# Patient Record
Sex: Male | Born: 1960 | Race: White | Hispanic: No | Marital: Married | Smoking: Never smoker
Health system: Southern US, Community
[De-identification: ages and names within clinical notes are randomized; demographics above are authoritative.]

## PROBLEM LIST (undated history)

## (undated) DIAGNOSIS — I10 Essential (primary) hypertension: Secondary | ICD-10-CM

## (undated) DIAGNOSIS — H40059 Ocular hypertension, unspecified eye: Secondary | ICD-10-CM

## (undated) DIAGNOSIS — I2510 Atherosclerotic heart disease of native coronary artery without angina pectoris: Secondary | ICD-10-CM

## (undated) DIAGNOSIS — I491 Atrial premature depolarization: Secondary | ICD-10-CM

## (undated) DIAGNOSIS — R9439 Abnormal result of other cardiovascular function study: Secondary | ICD-10-CM

## (undated) HISTORY — DX: Ocular hypertension, unspecified eye: H40.059

## (undated) HISTORY — DX: Atherosclerotic heart disease of native coronary artery without angina pectoris: I25.10

## (undated) HISTORY — DX: Abnormal result of other cardiovascular function study: R94.39

## (undated) HISTORY — PX: INGUINAL HERNIA REPAIR: SUR1180

## (undated) HISTORY — DX: Atrial premature depolarization: I49.1

## (undated) HISTORY — PX: ANKLE FRACTURE SURGERY: SHX122

---

## 2011-09-08 ENCOUNTER — Encounter (HOSPITAL_COMMUNITY): Payer: Self-pay | Admitting: Emergency Medicine

## 2011-09-08 ENCOUNTER — Inpatient Hospital Stay (HOSPITAL_COMMUNITY)
Admission: EM | Admit: 2011-09-08 | Discharge: 2011-09-12 | DRG: 491 | Disposition: A | Discharge status: Home / Self Care | Payer: 59 | Attending: Neurosurgery | Admitting: Neurosurgery

## 2011-09-08 ENCOUNTER — Observation Stay (HOSPITAL_COMMUNITY): Payer: 59

## 2011-09-08 DIAGNOSIS — I10 Essential (primary) hypertension: Secondary | ICD-10-CM | POA: Diagnosis present

## 2011-09-08 DIAGNOSIS — M545 Low back pain: Secondary | ICD-10-CM

## 2011-09-08 DIAGNOSIS — M5126 Other intervertebral disc displacement, lumbar region: Principal | ICD-10-CM | POA: Diagnosis present

## 2011-09-08 HISTORY — DX: Essential (primary) hypertension: I10

## 2011-09-08 MED ORDER — KETOROLAC TROMETHAMINE 30 MG/ML IJ SOLN
30.0000 mg | Freq: Four times a day (QID) | INTRAMUSCULAR | Status: DC | PRN
  Administered 2011-09-08 – 2011-09-10 (×3): 30 mg via INTRAVENOUS
  Filled 2011-09-08 (×2): qty 1

## 2011-09-08 MED ORDER — DIAZEPAM 5 MG/ML IJ SOLN
5.0000 mg | Freq: Once | INTRAMUSCULAR | Status: AC
  Administered 2011-09-08: 5 mg via INTRAVENOUS
  Filled 2011-09-08: qty 2

## 2011-09-08 MED ORDER — SODIUM CHLORIDE 0.9 % IV SOLN
1000.0000 mL | Freq: Once | INTRAVENOUS | Status: AC
  Administered 2011-09-08: 1000 mL via INTRAVENOUS

## 2011-09-08 MED ORDER — HYDROMORPHONE HCL PF 1 MG/ML IJ SOLN
1.0000 mg | Freq: Four times a day (QID) | INTRAMUSCULAR | Status: DC | PRN
  Administered 2011-09-08: 1 mg via INTRAVENOUS
  Filled 2011-09-08: qty 1

## 2011-09-08 MED ORDER — HYDROMORPHONE HCL PF 1 MG/ML IJ SOLN
1.0000 mg | Freq: Once | INTRAMUSCULAR | Status: AC
  Administered 2011-09-08: 1 mg via INTRAVENOUS
  Filled 2011-09-08: qty 1

## 2011-09-08 MED ORDER — DEXAMETHASONE SODIUM PHOSPHATE 10 MG/ML IJ SOLN
10.0000 mg | Freq: Once | INTRAMUSCULAR | Status: AC
  Administered 2011-09-08: 10 mg via INTRAVENOUS
  Filled 2011-09-08: qty 1

## 2011-09-08 MED ORDER — HYDROMORPHONE HCL PF 1 MG/ML IJ SOLN
1.0000 mg | INTRAMUSCULAR | Status: DC
  Administered 2011-09-09 (×3): 1 mg via INTRAVENOUS
  Filled 2011-09-08 (×3): qty 1

## 2011-09-08 MED ORDER — DIAZEPAM 5 MG PO TABS
5.0000 mg | ORAL_TABLET | Freq: Four times a day (QID) | ORAL | Status: DC | PRN
  Filled 2011-09-08 (×2): qty 1

## 2011-09-08 MED ORDER — SODIUM CHLORIDE 0.9 % IV SOLN
1000.0000 mL | Freq: Once | INTRAVENOUS | Status: DC
  Administered 2011-09-08: 1000 mL via INTRAVENOUS

## 2011-09-08 MED ORDER — SODIUM CHLORIDE 0.9 % IV SOLN
1000.0000 mL | INTRAVENOUS | Status: DC
  Administered 2011-09-09: 1000 mL via INTRAVENOUS

## 2011-09-08 MED ORDER — ONDANSETRON HCL 4 MG/2ML IJ SOLN
4.0000 mg | Freq: Four times a day (QID) | INTRAMUSCULAR | Status: DC | PRN
  Administered 2011-09-08 – 2011-09-09 (×3): 4 mg via INTRAVENOUS
  Filled 2011-09-08 (×3): qty 2

## 2011-09-08 MED ORDER — ACETAMINOPHEN 325 MG PO TABS: 650.0000 mg | ORAL_TABLET | ORAL | Status: DC | PRN

## 2011-09-08 NOTE — ED Provider Notes (Signed)
History   This chart was scribed for Hilario Quarry, MD by Melba Coon. The patient was seen in room TR09C/TR09C and the patient's care was started at 5:44PM.    CSN: [id number]  Arrival date & time 09/08/11  1723   None     Chief Complaint  Patient presents with  . Back Pain    (Consider location/radiation/quality/duration/timing/severity/associated sxs/prior treatment) The history is provided by the patient. No language interpreter was used.   Joshua Huynh is a 51 y.o. male who presents to the Emergency Department complaining of sharp, shooting, lower right back pain that radiates to the right hip with an onset 9 days ago. Pt was working out and dead lifting a normal amount of weight compared to baseline; pt had to stop workout when he felt a twinge in his back, but back started to feel better and pt continued workout. Pt woke up with the pain the next morning. Pt saw the chiropractor 7 days ago; pain got a little bit better. But 2 days ago, pt went on a roller coaster which aggravated the pain; had to leave theme park early due to the pain. Movement of the back aggravates the pain. Hydrocodone, ibuprofen, and tylenol yesterday slightly; last does of motrin was 2 PM today. No HA, fever, neck pain, sore throat, rash, CP, SOB, abd pain, n/v/d, loss of bowel or bladder control, dysuria, or extremity edema, weakness, numbness, or tingling. No known allergies. No other pertinent medical symptoms.   Past Medical History  Diagnosis Date  . Hypertension     No past surgical history on file.  No family history on file.  History  Substance Use Topics  . Smoking status: Not on file  . Smokeless tobacco: Not on file  . Alcohol Use:       Review of Systems 10 Systems reviewed and all are negative for acute change except as noted in the HPI.   Allergies  Review of patient's allergies indicates no known allergies.  Home Medications   Current Outpatient Rx  Name Route Sig  Dispense Refill  . CALCIUM PO Oral Take 1 tablet by mouth daily.    Marland Kitchen CO-ENZYME Q-10 50 MG PO CAPS Oral Take 50 mg by mouth daily.    . IBUPROFEN 600 MG PO TABS Oral Take 600 mg by mouth every 6 (six) hours as needed. For pain    . LOSARTAN POTASSIUM 50 MG PO TABS Oral Take 50 mg by mouth daily.    . ADULT MULTIVITAMIN W/MINERALS CH Oral Take 1 tablet by mouth daily.    Lanetta Inch ADVANCE PO Oral Take 1 tablet by mouth daily.    . OMEGA-3-ACID ETHYL ESTERS 1 G PO CAPS Oral Take 2 g by mouth 2 (two) times daily.      BP 142/90  Pulse 50  Temp 98 F (36.7 C) (Oral)  Resp 16  SpO2 100%  Physical Exam  Nursing note and vitals reviewed. Constitutional: He is oriented to person, place, and time. He appears well-developed and well-nourished. No distress.  HENT:  Head: Normocephalic and atraumatic.  Eyes: EOM are normal.  Neck: Neck supple. No tracheal deviation present.  Cardiovascular: Normal rate.   Pulmonary/Chest: Effort normal. No respiratory distress.  Musculoskeletal: Normal range of motion. He exhibits tenderness (Mild tenderness in lower right back).  Neurological: He is alert and oriented to person, place, and time. He has normal strength. No sensory deficit. GCS eye subscore is 4. GCS verbal subscore is 5. GCS  motor subscore is 6.  Reflex Scores:      Patellar reflexes are 3+ on the right side and 3+ on the left side.      Achilles reflexes are 1+ on the right side and 1+ on the left side.      Good equal lower extremity strength bilaterally; DTRs hyper-reflexive equal bilaterally. Gait not tested due to severe pain with movement  Skin: Skin is warm and dry.  Psychiatric: He has a normal mood and affect. His behavior is normal.    ED Course  Procedures (including critical care time)  DIAGNOSTIC STUDIES: Oxygen Saturation is 100% on room air, normal by my interpretation.    COORDINATION OF CARE:  5:49PM - pt wil be moved to CDU.   Labs Reviewed - No data to display No  results found.   No diagnosis found.    MDM  I personally performed the services described in this documentation, which was scribed in my presence. The recorded information has been reviewed and considered.   Patient care discussed with Elpidio Anis, PA-C and patient to be moved to CDU.     Hilario Quarry, MD 09/08/11 1800

## 2011-09-08 NOTE — ED Provider Notes (Signed)
The patient reports he is comfortable when he is lying still in bed. There is significant pain with any movement. No neurologic findings on re-examination, no subjective numbness or weakness. MRI showing severe central disc bulging causing stenosis L4-5. Discussed with Dr. Wynetta Emery (neurosurgery). Recommends attempt at pain control and discharge home, but if he fails to be controlled, he will admit for further treatment options. Plan - possibly back pain protocol, regular pain medications, steroids, muscle relaxers and reassess in the morning.   23:00 - attempted to ambulate patient. He states he feels better but unable to ambulate due to pain. Back pain protocol started. Plan is to reassess in the morning after steroids become effective and having regularly dosed pain medication. If pain is still uncontrolled, consult Dr. Wynetta Emery who is aware of plan.  Rodena Medin, PA-C 09/08/11 2302

## 2011-09-08 NOTE — ED Provider Notes (Signed)
History/physical exam/procedure(s) were performed by non-physician practitioner and as supervising physician I was immediately available for consultation/collaboration. I have reviewed all notes and am in agreement with care and plan.   Hilario Quarry, MD 09/08/11 [phone]

## 2011-09-08 NOTE — ED Notes (Signed)
Pt c/o lower back pain x 1 week and was unable to stand today due to pain; pt sts started while doing dead lifts and then went on a roller coaster and became more severe; pt denies incontinence

## 2011-09-09 ENCOUNTER — Encounter (HOSPITAL_COMMUNITY): Payer: Self-pay | Admitting: *Deleted

## 2011-09-09 MED ORDER — ONDANSETRON HCL 4 MG/2ML IJ SOLN
4.0000 mg | INTRAMUSCULAR | Status: DC | PRN
  Administered 2011-09-09 – 2011-09-10 (×3): 4 mg via INTRAVENOUS
  Filled 2011-09-09 (×3): qty 2

## 2011-09-09 MED ORDER — HYDROMORPHONE HCL PF 1 MG/ML IJ SOLN
1.0000 mg | INTRAMUSCULAR | Status: DC | PRN
  Administered 2011-09-09 – 2011-09-10 (×6): 1 mg via INTRAVENOUS
  Filled 2011-09-09 (×2): qty 1
  Filled 2011-09-09: qty 2
  Filled 2011-09-09 (×4): qty 1

## 2011-09-09 MED ORDER — DOCUSATE SODIUM 100 MG PO CAPS
100.0000 mg | ORAL_CAPSULE | Freq: Every day | ORAL | Status: DC
  Administered 2011-09-09 – 2011-09-12 (×4): 100 mg via ORAL
  Filled 2011-09-09 (×3): qty 1

## 2011-09-09 MED ORDER — OXYCODONE-ACETAMINOPHEN 5-325 MG PO TABS
2.0000 | ORAL_TABLET | Freq: Once | ORAL | Status: AC
  Administered 2011-09-09: 2 via ORAL
  Filled 2011-09-09: qty 2

## 2011-09-09 MED ORDER — ONDANSETRON HCL 4 MG/2ML IJ SOLN: 4.0000 mg | Freq: Three times a day (TID) | INTRAMUSCULAR | Status: DC | PRN

## 2011-09-09 MED ORDER — ONDANSETRON HCL 4 MG/2ML IJ SOLN: 4.0000 mg | Freq: Once | INTRAMUSCULAR | Status: DC

## 2011-09-09 MED ORDER — MAGNESIUM HYDROXIDE 400 MG/5ML PO SUSP: 30.0000 mL | Freq: Every evening | ORAL | Status: DC | PRN

## 2011-09-09 MED ORDER — HYDROMORPHONE HCL PF 1 MG/ML IJ SOLN: 1.0000 mg | INTRAMUSCULAR | Status: DC | PRN

## 2011-09-09 MED ORDER — DIAZEPAM 5 MG PO TABS
5.0000 mg | ORAL_TABLET | Freq: Once | ORAL | Status: AC
  Administered 2011-09-09: 5 mg via ORAL

## 2011-09-09 MED ORDER — DEXAMETHASONE SODIUM PHOSPHATE 10 MG/ML IJ SOLN
10.0000 mg | Freq: Four times a day (QID) | INTRAMUSCULAR | Status: AC
  Administered 2011-09-09 – 2011-09-10 (×4): 10 mg via INTRAVENOUS
  Filled 2011-09-09 (×4): qty 1

## 2011-09-09 MED ORDER — PANTOPRAZOLE SODIUM 40 MG PO TBEC
40.0000 mg | DELAYED_RELEASE_TABLET | Freq: Every day | ORAL | Status: DC
  Administered 2011-09-10 – 2011-09-12 (×3): 40 mg via ORAL
  Filled 2011-09-09: qty 1

## 2011-09-09 NOTE — ED Provider Notes (Signed)
7:31 AM Assumed care of patient in the CDU.  Patient is currently on back pain protocol.  Patient presenting yesterday with lower back pain that had been present for one week.  MRI showing severe central disc bulging causing stenosis L4-5.  Dr. Wynetta Emery with Neurosurgery had been consulted last evening and recommended putting the patient on Back Pain Protocol.  Patient has been given pain medications, muscle relaxers, and Decadron.  Plan is for patient to be discharged with Neurosurgery follow up if the pain has improved.  Reassessed patient.  He reports that his pain is tolerable when he is sitting still, but the pain increases with movement.  Patient has not had a muscle relaxer in 12 hours and has not had any pain medication in 5 hours.  Will order oral pain medication and muscle relaxer and then reassess.    9:58 AM Dr Wynetta Emery with Neurosurgery has been by to see and evaluate the patient.  According to the patient, Dr. Wynetta Emery will be back to see the patient later on this morning after he finishes surgery.  Attempted to ambulate the patient.  Patient unable to ambulate secondary to pain.  He was only able to take a couple of steps.  Gait unsteady.    12:45 PM Discussed patient with Dr. Wynetta Emery and informed him that the patient is having difficulty ambulating.  He recommends having the patient admitted to his service.  He will be by to see the patient later today.  Temporary admission orders have been placed.  Pascal Lux [place], PA-C 09/09/11 1621

## 2011-09-09 NOTE — ED Notes (Signed)
Report received, assumed care.  

## 2011-09-09 NOTE — Consult Note (Signed)
Reason for Consult: Back and right hip pain Referring Physician: Emergency department  Joshua Huynh is an 51 y.o. male.  HPI: Patient is a very pleasant 51 year old gentleman has had a week and a half a progress worsening back and probably right buttock and upper thigh pain is good progress he worse over the last few days he denies any true radiation further down below his knee although he says he walks on weightbears he does get an occasional feeling of outcome and as thigh he denies any numbness tingling his and his feet denies any bowel bladder complaints and denies any pain in the left leg.  Past Medical History  Diagnosis Date  . Hypertension     History reviewed. No pertinent past surgical history.  History reviewed. No pertinent family history.  Social History:  does not have a smoking history on file. He does not have any smokeless tobacco history on file. His alcohol and drug histories not on file.  Allergies: No Known Allergies  Medications: I have reviewed the patient's current medications.  No results found for this or any previous visit (from the past 48 hour(s)).  Joshua Huynh  09/08/2011  *RADIOLOGY REPORT*  Clinical Data: Low back pain radiating into the right groin and leg.  MRI LUMBAR SPINE WITHOUT Huynh  Technique:  Multiplanar and multiecho pulse sequences of the lumbar spine were obtained without intravenous Huynh.  Comparison: None.  Findings: Vertebral body height and alignment are maintained. Small hemangiomas are noted at L4 in S2.  There is no worrisome marrow lesion.  Degenerative discogenic marrow signal change is identified at L5-S1.  There is convex right scoliosis.  The conus medullaris is normal in signal and position.  No pars interarticularis defect is identified.  Imaged intra-abdominal contents are unremarkable.  The T11-12 level is imaged in the sagittal plane only and negative.  T12-L1:  Minimal disc bulge without central  canal or foraminal narrowing.  L1-2:  Negative.  L2-3:  There is some ligamentum flavum thickening and mild facet degenerative disease.  No disc bulge or protrusion.  The central spinal canal and neural foramina are  widely patent.  L3-4:  Disc bulge with some facet arthropathy and ligamentum flavum thickening identified.  The central spinal canal and neural foramina remain open.  L4-5:  The patient has a large central disc protrusion.  There may be a small amount of hemorrhage in association with the disc.  The central spinal canal and lateral recesses are severely narrowed. Foramina appear open.  L5-S1:  There is a mild disc bulge and a downturning right lateral recess protrusion.  The central canal is open but there is encroachment on the descending right S1 root in the lateral recess. Neural foramina are open.  IMPRESSION:  1.  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 2.  Disc bulge with a superimposed downturning right lateral recess protrusion at L5-S1 results in encroachment on the descending right S1 root.  Original Report Authenticated By: Bernadene Bell. Joshua Huynh, M.D.    @ROS @ Blood pressure 143/84, pulse 50, temperature 98 F (36.7 C), temperature source Oral, resp. rate 18, SpO2 98.00%. Patient is awake alert oriented 4 strength is 5 out of 5 his M. is lower surgery was also 5 out of 5 his iliopsoas, quads, hip she's, gastric, anterior tibialis, and EHL. Sensation is grossly intact in his lower extremities he does have positive straight leg raise at 30 he has  decreased ankle  jerks bilaterally  Assessment/Plan: 51 year old gentleman presents with very large disc herniation at L4-5 causing severe thecal sac compression and consistent with right L4 and L5 radiculopathy he also has a small disc herniation L5-S1 displacing the proximal aspect of the right S1 nerve root this also could be contributing patient does feel some better on the IV steroids  Decadron given the ER we'll continue to watch and see you for this she hours and this was basically stable and be discharged home with this debridement proceed forward dura with a laminectomy during this hospitalization.  Joshua Huynh 09/09/2011, 1:19 AM

## 2011-09-09 NOTE — Progress Notes (Signed)
Observation review is complete. 

## 2011-09-09 NOTE — Progress Notes (Signed)
Subjective: Patient reports He feels better this morning her pain is nondominant is significantly improved he was sitting up in bed with comfortable.  Objective: Vital signs in last 24 hours: Temp:  [97.7 F (36.5 C)-98 F (36.7 C)] 97.7 F (36.5 C) (08/13 0617) Pulse Rate:  [50-62] 62  (08/13 0617) Resp:  [16-18] 16  (08/13 0254) BP: (111-143)/(63-90) 111/63 mmHg (08/13 0617) SpO2:  [98 %-100 %] 98 % (08/13 0617)  Intake/Output from previous day:   Intake/Output this shift:    Strength out of 5  Lab Results: No results found for this basename: WBC:2,HGB:2,HCT:2,PLT:2 in the last 72 hours BMET No results found for this basename: NA:2,K:2,CL:2,CO2:2,GLUCOSE:2,BUN:2,CREATININE:2,CALCIUM:2 in the last 72 hours  Studies/Results: Mr Lumbar Spine Wo Contrast  09/08/2011  *RADIOLOGY REPORT*  Clinical Data: Low back pain radiating into the right groin and leg.  MRI LUMBAR SPINE WITHOUT CONTRAST  Technique:  Multiplanar and multiecho pulse sequences of the lumbar spine were obtained without intravenous contrast.  Comparison: None.  Findings: Vertebral body height and alignment are maintained. Small hemangiomas are noted at L4 in S2.  There is no worrisome marrow lesion.  Degenerative discogenic marrow signal change is identified at L5-S1.  There is convex right scoliosis.  The conus medullaris is normal in signal and position.  No pars interarticularis defect is identified.  Imaged intra-abdominal contents are unremarkable.  The T11-12 level is imaged in the sagittal plane only and negative.  T12-L1:  Minimal disc bulge without central canal or foraminal narrowing.  L1-2:  Negative.  L2-3:  There is some ligamentum flavum thickening and mild facet degenerative disease.  No disc bulge or protrusion.  The central spinal canal and neural foramina are  widely patent.  L3-4:  Disc bulge with some facet arthropathy and ligamentum flavum thickening identified.  The central spinal canal and neural  foramina remain open.  L4-5:  The patient has a large central disc protrusion.  There may be a small amount of hemorrhage in association with the disc.  The central spinal canal and lateral recesses are severely narrowed. Foramina appear open.  L5-S1:  There is a mild disc bulge and a downturning right lateral recess protrusion.  The central canal is open but there is encroachment on the descending right S1 root in the lateral recess. Neural foramina are open.  IMPRESSION:  1.  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 2.  Disc bulge with a superimposed downturning right lateral recess protrusion at L5-S1 results in encroachment on the descending right S1 root.  Original Report Authenticated By: Bernadene Bell. Maricela Curet, M.D.    Assessment/Plan: Continue mobilization as morning it is painful we will out: The home alone a discharge home probably will need a laminectomy discectomy Thursday or Friday we'll schedule accordingly based on how he does morning  LOS: 1 day     Joshua Huynh 09/09/2011, 9:32 AM

## 2011-09-09 NOTE — Progress Notes (Signed)
RN notified of abnormal BP

## 2011-09-10 ENCOUNTER — Encounter (HOSPITAL_COMMUNITY): Payer: Self-pay | Admitting: Certified Registered"

## 2011-09-10 ENCOUNTER — Inpatient Hospital Stay (HOSPITAL_COMMUNITY): Payer: 59

## 2011-09-10 ENCOUNTER — Inpatient Hospital Stay (HOSPITAL_COMMUNITY): Payer: 59 | Admitting: Certified Registered"

## 2011-09-10 ENCOUNTER — Encounter (HOSPITAL_COMMUNITY)
Admission: EM | Disposition: A | Discharge status: Home / Self Care | Payer: Self-pay | Source: Home / Self Care | Attending: Neurosurgery

## 2011-09-10 HISTORY — PX: LUMBAR LAMINECTOMY/DECOMPRESSION MICRODISCECTOMY: SHX5026

## 2011-09-10 LAB — POCT I-STAT 4, (NA,K, GLUC, HGB,HCT)
Glucose, Bld: 127 mg/dL — ABNORMAL HIGH (ref 70–99)
HCT: 40 % (ref 39.0–52.0)

## 2011-09-10 SURGERY — LUMBAR LAMINECTOMY/DECOMPRESSION MICRODISCECTOMY 1 LEVEL
Anesthesia: General | Site: Back | Laterality: Right | Wound class: Clean

## 2011-09-10 MED ORDER — LIDOCAINE HCL (CARDIAC) 20 MG/ML IV SOLN
INTRAVENOUS | Status: DC | PRN
  Administered 2011-09-10: 50 mg via INTRAVENOUS

## 2011-09-10 MED ORDER — 0.9 % SODIUM CHLORIDE (POUR BTL) OPTIME
TOPICAL | Status: DC | PRN
  Administered 2011-09-10: 1000 mL

## 2011-09-10 MED ORDER — HEMOSTATIC AGENTS (NO CHARGE) OPTIME
TOPICAL | Status: DC | PRN
  Administered 2011-09-10: 1 via TOPICAL

## 2011-09-10 MED ORDER — FENTANYL CITRATE 0.05 MG/ML IJ SOLN
INTRAMUSCULAR | Status: DC | PRN
  Administered 2011-09-10: 100 ug via INTRAVENOUS
  Administered 2011-09-10 (×2): 50 ug via INTRAVENOUS
  Administered 2011-09-10: 100 ug via INTRAVENOUS
  Administered 2011-09-10 (×2): 50 ug via INTRAVENOUS
  Administered 2011-09-10: 100 ug via INTRAVENOUS

## 2011-09-10 MED ORDER — CEFAZOLIN SODIUM-DEXTROSE 2-3 GM-% IV SOLR
INTRAVENOUS | Status: AC
  Filled 2011-09-10: qty 50

## 2011-09-10 MED ORDER — ROCURONIUM BROMIDE 100 MG/10ML IV SOLN
INTRAVENOUS | Status: DC | PRN
  Administered 2011-09-10: 40 mg via INTRAVENOUS
  Administered 2011-09-10: 10 mg via INTRAVENOUS

## 2011-09-10 MED ORDER — LIDOCAINE-EPINEPHRINE 1 %-1:100000 IJ SOLN
INTRAMUSCULAR | Status: DC | PRN
  Administered 2011-09-10: 10 mL via INTRADERMAL

## 2011-09-10 MED ORDER — THROMBIN 5000 UNITS EX KIT
PACK | CUTANEOUS | Status: DC | PRN
  Administered 2011-09-10 (×2): 5000 [IU] via TOPICAL

## 2011-09-10 MED ORDER — BUPIVACAINE HCL (PF) 0.25 % IJ SOLN
INTRAMUSCULAR | Status: DC | PRN
  Administered 2011-09-10: 10 mL

## 2011-09-10 MED ORDER — MIDAZOLAM HCL 5 MG/5ML IJ SOLN
INTRAMUSCULAR | Status: DC | PRN
  Administered 2011-09-10: 2 mg via INTRAVENOUS

## 2011-09-10 MED ORDER — LACTATED RINGERS IV SOLN
INTRAVENOUS | Status: DC | PRN
  Administered 2011-09-10 (×3): via INTRAVENOUS

## 2011-09-10 MED ORDER — SODIUM CHLORIDE 0.9 % IR SOLN
Status: DC | PRN
  Administered 2011-09-10: 21:00:00

## 2011-09-10 MED ORDER — DROPERIDOL 2.5 MG/ML IJ SOLN
INTRAMUSCULAR | Status: DC | PRN
  Administered 2011-09-10: .6 mg via INTRAVENOUS

## 2011-09-10 MED ORDER — CEFAZOLIN SODIUM-DEXTROSE 2-3 GM-% IV SOLR
INTRAVENOUS | Status: DC | PRN
  Administered 2011-09-10: 2 g via INTRAVENOUS

## 2011-09-10 MED ORDER — PROPOFOL 10 MG/ML IV BOLUS
INTRAVENOUS | Status: DC | PRN
  Administered 2011-09-10: 200 mg via INTRAVENOUS

## 2011-09-10 SURGICAL SUPPLY — 55 items
BAG DECANTER FOR FLEXI CONT (MISCELLANEOUS) ×2 IMPLANT
BENZOIN TINCTURE PRP APPL 2/3 (GAUZE/BANDAGES/DRESSINGS) ×2 IMPLANT
BLADE SURG 11 STRL SS (BLADE) ×2 IMPLANT
BLADE SURG ROTATE 9660 (MISCELLANEOUS) IMPLANT
BRUSH SCRUB EZ PLAIN DRY (MISCELLANEOUS) ×2 IMPLANT
BUR MATCHSTICK NEURO 3.0 LAGG (BURR) ×2 IMPLANT
BUR PRECISION FLUTE 6.0 (BURR) ×2 IMPLANT
CANISTER SUCTION 2500CC (MISCELLANEOUS) ×2 IMPLANT
CLOTH BEACON ORANGE TIMEOUT ST (SAFETY) ×2 IMPLANT
CONT SPEC 4OZ CLIKSEAL STRL BL (MISCELLANEOUS) ×2 IMPLANT
DECANTER SPIKE VIAL GLASS SM (MISCELLANEOUS) IMPLANT
DERMABOND ADVANCED (GAUZE/BANDAGES/DRESSINGS) ×1
DERMABOND ADVANCED .7 DNX12 (GAUZE/BANDAGES/DRESSINGS) ×1 IMPLANT
DRAPE LAPAROTOMY 100X72X124 (DRAPES) ×2 IMPLANT
DRAPE MICROSCOPE LEICA (MISCELLANEOUS) ×2 IMPLANT
DRAPE POUCH INSTRU U-SHP 10X18 (DRAPES) ×2 IMPLANT
DRAPE PROXIMA HALF (DRAPES) IMPLANT
DRAPE SURG 17X23 STRL (DRAPES) ×2 IMPLANT
DRSG OPSITE 4X5.5 SM (GAUZE/BANDAGES/DRESSINGS) ×2 IMPLANT
ELECT REM PT RETURN 9FT ADLT (ELECTROSURGICAL) ×2
ELECTRODE REM PT RTRN 9FT ADLT (ELECTROSURGICAL) ×1 IMPLANT
GAUZE SPONGE 4X4 16PLY XRAY LF (GAUZE/BANDAGES/DRESSINGS) IMPLANT
GLOVE BIO SURGEON STRL SZ 6.5 (GLOVE) ×4 IMPLANT
GLOVE BIO SURGEON STRL SZ8 (GLOVE) ×2 IMPLANT
GLOVE BIOGEL PI IND STRL 6.5 (GLOVE) ×1 IMPLANT
GLOVE BIOGEL PI IND STRL 8 (GLOVE) ×1 IMPLANT
GLOVE BIOGEL PI INDICATOR 6.5 (GLOVE) ×1
GLOVE BIOGEL PI INDICATOR 8 (GLOVE) ×1
GLOVE ECLIPSE 7.5 STRL STRAW (GLOVE) ×4 IMPLANT
GLOVE EXAM NITRILE LRG STRL (GLOVE) IMPLANT
GLOVE EXAM NITRILE MD LF STRL (GLOVE) ×2 IMPLANT
GLOVE EXAM NITRILE XL STR (GLOVE) IMPLANT
GLOVE EXAM NITRILE XS STR PU (GLOVE) IMPLANT
GLOVE INDICATOR 8.5 STRL (GLOVE) ×2 IMPLANT
GOWN BRE IMP SLV AUR LG STRL (GOWN DISPOSABLE) ×4 IMPLANT
GOWN BRE IMP SLV AUR XL STRL (GOWN DISPOSABLE) ×2 IMPLANT
GOWN STRL REIN 2XL LVL4 (GOWN DISPOSABLE) IMPLANT
KIT BASIN OR (CUSTOM PROCEDURE TRAY) ×2 IMPLANT
KIT ROOM TURNOVER OR (KITS) ×2 IMPLANT
NEEDLE HYPO 22GX1.5 SAFETY (NEEDLE) ×2 IMPLANT
NS IRRIG 1000ML POUR BTL (IV SOLUTION) ×2 IMPLANT
PACK LAMINECTOMY NEURO (CUSTOM PROCEDURE TRAY) ×2 IMPLANT
RUBBERBAND STERILE (MISCELLANEOUS) ×4 IMPLANT
SPONGE GAUZE 4X4 12PLY (GAUZE/BANDAGES/DRESSINGS) ×2 IMPLANT
SPONGE SURGIFOAM ABS GEL SZ50 (HEMOSTASIS) ×2 IMPLANT
STRIP CLOSURE SKIN 1/2X4 (GAUZE/BANDAGES/DRESSINGS) ×2 IMPLANT
SUT VIC AB 0 CT1 18XCR BRD8 (SUTURE) ×1 IMPLANT
SUT VIC AB 0 CT1 8-18 (SUTURE) ×1
SUT VIC AB 2-0 CT1 18 (SUTURE) ×2 IMPLANT
SUT VICRYL 4-0 PS2 18IN ABS (SUTURE) ×2 IMPLANT
SYR 20ML ECCENTRIC (SYRINGE) ×2 IMPLANT
THROMBIN 5,000 UNITS ×2 IMPLANT
TOWEL OR 17X24 6PK STRL BLUE (TOWEL DISPOSABLE) ×2 IMPLANT
TOWEL OR 17X26 10 PK STRL BLUE (TOWEL DISPOSABLE) ×2 IMPLANT
WATER STERILE IRR 1000ML POUR (IV SOLUTION) ×2 IMPLANT

## 2011-09-10 NOTE — Anesthesia Preprocedure Evaluation (Addendum)
Anesthesia Evaluation  Patient identified by MRN, date of birth, ID band Patient awake    Reviewed: Allergy & Precautions, H&P , NPO status , Patient's Chart, lab work & pertinent test results  Airway Mallampati: I TM Distance: >3 FB Neck ROM: Full    Dental No notable dental hx. (+) Dental Advisory Given and Teeth Intact   Pulmonary neg pulmonary ROS,    Pulmonary exam normal       Cardiovascular hypertension, Pt. on medications     Neuro/Psych    GI/Hepatic negative GI ROS, Neg liver ROS,   Endo/Other  negative endocrine ROS  Renal/GU negative Renal ROS     Musculoskeletal   Abdominal Normal abdominal exam  (+)   Peds  Hematology negative hematology ROS (+)   Anesthesia Other Findings   Reproductive/Obstetrics                        Anesthesia Physical Anesthesia Plan  ASA: II  Anesthesia Plan: General   Post-op Pain Management:    Induction: Intravenous  Airway Management Planned: Oral ETT  Additional Equipment:   Intra-op Plan:   Post-operative Plan: Extubation in OR  Informed Consent: I have reviewed the patients History and Physical, chart, labs and discussed the procedure including the risks, benefits and alternatives for the proposed anesthesia with the patient or authorized representative who has indicated his/her understanding and acceptance.   Dental Advisory Given  Plan Discussed with: Anesthesiologist, CRNA and Surgeon  Anesthesia Plan Comments:        Anesthesia Quick Evaluation

## 2011-09-10 NOTE — Anesthesia Procedure Notes (Signed)
Procedure Name: Intubation Date/Time: 09/10/2011 8:57 PM Performed by: Alanda Amass A Pre-anesthesia Checklist: Patient identified, Timeout performed, Emergency Drugs available, Suction available and Patient being monitored Patient Re-evaluated:Patient Re-evaluated prior to inductionOxygen Delivery Method: Circle system utilized Preoxygenation: Pre-oxygenation with 100% oxygen Intubation Type: IV induction Ventilation: Mask ventilation without difficulty Laryngoscope Size: Mac and 3 Grade View: Grade I Tube type: Oral Tube size: 7.5 mm Number of attempts: 1 Airway Equipment and Method: Stylet Placement Confirmation: ETT inserted through vocal cords under direct vision,  positive ETCO2 and breath sounds checked- equal and bilateral Secured at: 21 cm Tube secured with: Tape Dental Injury: Teeth and Oropharynx as per pre-operative assessment

## 2011-09-10 NOTE — Care Management Note (Signed)
    Page 1 of 1   09/12/2011     1:45:59 PM   CARE MANAGEMENT NOTE 09/12/2011  Patient:  Joshua Huynh, Joshua Huynh   Account Number:  [id number]  Date Initiated:  09/10/2011  Documentation initiated by:  Onnie Boer  Subjective/Objective Assessment:   PT WAS ADMITTED WITH BACK AND HIP PAIN     Action/Plan:   PROGRESSION OF CARE AND DISCHARGE PLANNING   Anticipated DC Date:  09/14/2011   Anticipated DC Plan:  HOME W HOME HEALTH SERVICES      DC Planning Services  CM consult      Choice offered to / List presented to:     DME arranged  Levan Hurst      DME agency  [facility]        Status of service:  Completed, signed off Medicare Important Message given?   (If response is "NO", the following Medicare IM given date fields will be blank) Date Medicare IM given:   Date Additional Medicare IM given:    Discharge Disposition:  HOME/SELF CARE  Per UR Regulation:  Reviewed for med. necessity/level of care/duration of stay  If discussed at Long Length of Stay Meetings, dates discussed:    Comments:  09/12/11 Onnie Boer, RN, BSN 1345 PT IS TO DC TO HOME WITH SELF CARE AND A RW FROM [facility].  09/10/11 Onnie Boer, RN, BSN 1418 PT ADMITTED WITH BACK AND HIP PAIN FROM HOME.  PT MAY HAVE SURGERY TODAY OR TOMORROW.  WILL F/U ON DC NEEDS AND PT/OT EVAL RECOMMENDATIONS.

## 2011-09-10 NOTE — Anesthesia Postprocedure Evaluation (Signed)
Anesthesia Post Note  Patient: Joshua Huynh  Procedure(s) Performed: Procedure(s) (LRB): LUMBAR LAMINECTOMY/DECOMPRESSION MICRODISCECTOMY 1 LEVEL (Right)  Anesthesia type: general  Patient location: PACU  Post pain: Pain level controlled  Post assessment: Patient's Cardiovascular Status Stable  Last Vitals:  Filed Vitals:   09/10/11 2345  BP: 125/61  Pulse: 61  Temp:   Resp: 12    Post vital signs: Reviewed and stable  Level of consciousness: sedated  Complications: No apparent anesthesia complications

## 2011-09-10 NOTE — Progress Notes (Signed)
Subjective: Patient reports Slightly better today however still unable to ambulate severe leg pain is expansile but a numbness that is new for him to  Objective: Vital signs in last 24 hours: Temp:  [97.6 F (36.4 C)-98.2 F (36.8 C)] 97.9 F (36.6 C) (08/14 0625) Pulse Rate:  [52-72] 52  (08/14 0625) Resp:  [16-18] 16  (08/14 0625) BP: (121-166)/(52-74) 128/58 mmHg (08/14 0625) SpO2:  [95 %-100 %] 97 % (08/14 0625) Weight:  [80.74 kg (178 lb)] 80.74 kg (178 lb) (08/13 2100)  Intake/Output from previous day: 08/13 0701 - 08/14 0700 In: 200 [P.O.:200] Out: -  Intake/Output this shift:    Strength is 5 out of 5 with  Lab Results: No results found for this basename: WBC:2,HGB:2,HCT:2,PLT:2 in the last 72 hours BMET No results found for this basename: NA:2,K:2,CL:2,CO2:2,GLUCOSE:2,BUN:2,CREATININE:2,CALCIUM:2 in the last 72 hours  Studies/Results: Mr Lumbar Spine Wo Contrast  09/08/2011  *RADIOLOGY REPORT*  Clinical Data: Low back pain radiating into the right groin and leg.  MRI LUMBAR SPINE WITHOUT CONTRAST  Technique:  Multiplanar and multiecho pulse sequences of the lumbar spine were obtained without intravenous contrast.  Comparison: None.  Findings: Vertebral body height and alignment are maintained. Small hemangiomas are noted at L4 in S2.  There is no worrisome marrow lesion.  Degenerative discogenic marrow signal change is identified at L5-S1.  There is convex right scoliosis.  The conus medullaris is normal in signal and position.  No pars interarticularis defect is identified.  Imaged intra-abdominal contents are unremarkable.  The T11-12 level is imaged in the sagittal plane only and negative.  T12-L1:  Minimal disc bulge without central canal or foraminal narrowing.  L1-2:  Negative.  L2-3:  There is some ligamentum flavum thickening and mild facet degenerative disease.  No disc bulge or protrusion.  The central spinal canal and neural foramina are  widely patent.  L3-4:  Disc  bulge with some facet arthropathy and ligamentum flavum thickening identified.  The central spinal canal and neural foramina remain open.  L4-5:  The patient has a large central disc protrusion.  There may be a small amount of hemorrhage in association with the disc.  The central spinal canal and lateral recesses are severely narrowed. Foramina appear open.  L5-S1:  There is a mild disc bulge and a downturning right lateral recess protrusion.  The central canal is open but there is encroachment on the descending right S1 root in the lateral recess. Neural foramina are open.  IMPRESSION:  1.  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 2.  Disc bulge with a superimposed downturning right lateral recess protrusion at L5-S1 results in encroachment on the descending right S1 root.  Original Report Authenticated By: Bernadene Bell. Maricela Curet, M.D.    Assessment/Plan: Patient with severe back pain right leg pain consistent with L5 and L4 nerve pattern with a large disc herniation L4-5 small discoloration L5-S1 plan is lumbar laminectomy discectomy possibly either this afternoon or tomorrow  LOS: 2 days     Josmar Messimer P 09/10/2011, 8:39 AM

## 2011-09-10 NOTE — Transfer of Care (Signed)
Immediate Anesthesia Transfer of Care Note  Patient: Joshua Huynh  Procedure(s) Performed: Procedure(s) (LRB): LUMBAR LAMINECTOMY/DECOMPRESSION MICRODISCECTOMY 1 LEVEL (Right)  Patient Location: PACU  Anesthesia Type: General  Level of Consciousness: awake  Airway & Oxygen Therapy: Patient Spontanous Breathing and Patient connected to nasal cannula oxygen  Post-op Assessment: Report given to PACU RN and Post -op Vital signs reviewed and stable  Post vital signs: Reviewed and stable  Complications: No apparent anesthesia complications

## 2011-09-10 NOTE — Progress Notes (Signed)
RN was notified of abnormal BP  

## 2011-09-10 NOTE — Op Note (Signed)
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  EBL: Minimal  History of present illness: Patient is a 51 year old gentleman presented emergent R. with severe back and probably right hip and upper leg pain workup with MRI scan showed a very large disc herniation with possible epidural hematoma she was placed on IV steroids was observed initiated on some ambulation however the pain never came in the adequate control he was unable to ambulate despite 48 hours of IV Decadron. Due to his failure conservative treatment has progressed clinical syndrome size location fragment with impending cauda equina syndrome patient recommended laminectomy microdiscectomy at both levels L4-5 L5-S1 and benefits of the operation were cemented patient as well as perioperative course and expectations of outcome alternatives of surgery and should agree to proceed forward.  Operative procedure: Patient brought into the or was induced under general anesthesia positioned prone the Wilson frame his back was prepped and draped in routine sterile fashion preoperative localizing appropriate level so after infiltration 10 cc lidocaine with epi a midline incision was made and Bovie light cautery was used to take down the subcutaneous tissues and subperiosteal dissection was carried on the lamina of L4-5 on S1. Intraoperative x-ray identified the appropriate level so the interest of L4 medial facet complex aggressive L5 is an drilled down and then using a 2 and 3 Kerrison punch laminotomy was begun the ligament was identified and  removed in piecemeal fashion. At this point the operating next of was draped and brought into the field him explanation to suction the markedly stented and under significant compression from herniation from underneath so the ligament was dissected off of the dura with a 4 Penfield and removed piecemeal fashion the L5 nerve root was immediately visualized the disc presented in the axilla of the L5 nerve roots a working in the axilla the annulus was incised using I nerve hook several very large free fragments of this were teased out the axilla this significantly because of the decompress the thecal sac to at this point the L5 nerve root as it was mobilized and reflected medially disc space was further incised and cleanout pituitary rongeurs using Epstein curettes suture rongeurs the disc spaces cleanout there is no further stenosis I was able result across the midline until no resistance with both hockey stick and coronary dilator does not feel like a letter additional fracture closure was indicated at the discectomy there is no further stenosis was packed with Gelfoam to second L5-S1 and a similar fashion L5-S1 was drilled down laminotomy was begun ligament is released to fashion the S1 nerve was identified the S1 pedicle was identified and there was a very large disc herniation at posterior calcified causing severe stenosis at this level so that the S1 nerve was reflected the medial medially annulotomy was made calcified disc is in place down and Epstein removed with pituitary rongeurs the discectomy here as thecal sac and right S1 nerve root was adequately decompressed both laminotomies were significantly irrigated and hemostasis was maintained Gelfoam was laid up the dura the muscle fascia pressure layers with Vicryl and the skin was) 4 septic or benzoin Steri-Strips were applied patient recovered in  stable condition at the end of case on it counts and sponge counts were correct.

## 2011-09-11 ENCOUNTER — Encounter (HOSPITAL_COMMUNITY): Payer: Self-pay | Admitting: Neurosurgery

## 2011-09-11 MED ORDER — PHENOL 1.4 % MT LIQD: 1.0000 | OROMUCOSAL | Status: DC | PRN

## 2011-09-11 MED ORDER — CYCLOBENZAPRINE HCL 10 MG PO TABS
10.0000 mg | ORAL_TABLET | Freq: Three times a day (TID) | ORAL | Status: DC | PRN
  Administered 2011-09-11 – 2011-09-12 (×3): 10 mg via ORAL
  Filled 2011-09-11 (×3): qty 1

## 2011-09-11 MED ORDER — POLYETHYLENE GLYCOL 3350 17 G PO PACK
17.0000 g | PACK | Freq: Every day | ORAL | Status: DC
  Administered 2011-09-11 – 2011-09-12 (×2): 17 g via ORAL
  Filled 2011-09-11 (×3): qty 1

## 2011-09-11 MED ORDER — ACETAMINOPHEN 325 MG PO TABS: 650.0000 mg | ORAL_TABLET | ORAL | Status: DC | PRN

## 2011-09-11 MED ORDER — SODIUM CHLORIDE 0.9 % IJ SOLN: 3.0000 mL | INTRAMUSCULAR | Status: DC | PRN

## 2011-09-11 MED ORDER — SODIUM CHLORIDE 0.9 % IV SOLN: 250.0000 mL | INTRAVENOUS | Status: DC

## 2011-09-11 MED ORDER — MENTHOL 3 MG MT LOZG: 1.0000 | LOZENGE | OROMUCOSAL | Status: DC | PRN

## 2011-09-11 MED ORDER — SODIUM CHLORIDE 0.9 % IJ SOLN
3.0000 mL | Freq: Two times a day (BID) | INTRAMUSCULAR | Status: DC
  Administered 2011-09-11: 3 mL via INTRAVENOUS

## 2011-09-11 MED ORDER — ADULT MULTIVITAMIN W/MINERALS CH
1.0000 | ORAL_TABLET | Freq: Every day | ORAL | Status: DC
  Administered 2011-09-11 – 2011-09-12 (×2): 1 via ORAL
  Filled 2011-09-11 (×2): qty 1

## 2011-09-11 MED ORDER — OMEGA-3-ACID ETHYL ESTERS 1 G PO CAPS
2.0000 g | ORAL_CAPSULE | Freq: Two times a day (BID) | ORAL | Status: DC
  Administered 2011-09-11 – 2011-09-12 (×4): 2 g via ORAL
  Filled 2011-09-11 (×5): qty 2

## 2011-09-11 MED ORDER — CEFAZOLIN SODIUM 1-5 GM-% IV SOLN
1.0000 g | Freq: Three times a day (TID) | INTRAVENOUS | Status: AC
  Administered 2011-09-11 (×2): 1 g via INTRAVENOUS
  Filled 2011-09-11 (×2): qty 50

## 2011-09-11 MED ORDER — LOSARTAN POTASSIUM 50 MG PO TABS
50.0000 mg | ORAL_TABLET | Freq: Every day | ORAL | Status: DC
  Administered 2011-09-11 – 2011-09-12 (×2): 50 mg via ORAL
  Filled 2011-09-11 (×2): qty 1

## 2011-09-11 MED ORDER — ONDANSETRON HCL 4 MG/2ML IJ SOLN
4.0000 mg | INTRAMUSCULAR | Status: DC | PRN
  Administered 2011-09-11: 4 mg via INTRAVENOUS
  Filled 2011-09-11: qty 2

## 2011-09-11 MED ORDER — ACETAMINOPHEN 650 MG RE SUPP: 650.0000 mg | RECTAL | Status: DC | PRN

## 2011-09-11 MED ORDER — HYDROMORPHONE HCL PF 1 MG/ML IJ SOLN
0.5000 mg | INTRAMUSCULAR | Status: DC | PRN
Start: 2011-09-11 — End: 2011-09-12
  Administered 2011-09-11: 1 mg via INTRAVENOUS
  Filled 2011-09-11 (×2): qty 1

## 2011-09-11 MED ORDER — OXYCODONE-ACETAMINOPHEN 5-325 MG PO TABS
1.0000 | ORAL_TABLET | ORAL | Status: DC | PRN
  Administered 2011-09-11 – 2011-09-12 (×5): 2 via ORAL
  Filled 2011-09-11 (×5): qty 2

## 2011-09-11 MED ORDER — DEXAMETHASONE SODIUM PHOSPHATE 10 MG/ML IJ SOLN
10.0000 mg | Freq: Four times a day (QID) | INTRAMUSCULAR | Status: AC
  Administered 2011-09-11 – 2011-09-12 (×4): 10 mg via INTRAVENOUS
  Filled 2011-09-11 (×4): qty 1

## 2011-09-11 NOTE — Evaluation (Signed)
Physical Therapy Evaluation Patient Details Name: Joshua Huynh MRN: [id number] DOB: 1960-08-11 Today's Date: 09/11/2011 Time: 7829-5621 PT Time Calculation (min): 29 min  PT Assessment / Plan / Recommendation Clinical Impression  Pt s/p L4-5 and L5-S1 lami/microdiscectomy. Pt is near baseline functional level, requiring increased assistance with sit/stand as well as safety on stairs for d/c home. Pt will benefit from skilled PT in the acute care setting in order to maximize functional mobility and safety prior to d/c with wife.    PT Assessment  Patient needs continued PT services    Follow Up Recommendations  No PT follow up;Supervision for mobility/OOB    Barriers to Discharge        Equipment Recommendations  Rolling walker with 5" wheels    Recommendations for Other Services     Frequency Min 5X/week    Precautions / Restrictions Precautions Precautions: Back Precaution Booklet Issued: Yes (comment) Precaution Comments: pt educated on 3/3 back precautions Restrictions Weight Bearing Restrictions: No         Mobility  Bed Mobility Bed Mobility: Rolling Left;Left Sidelying to Sit;Sitting - Scoot to Edge of Bed;Sit to Sidelying Left Rolling Left: 5: Supervision Left Sidelying to Sit: 5: Supervision Sitting - Scoot to Edge of Bed: 5: Supervision Sit to Sidelying Left: 5: Supervision Details for Bed Mobility Assistance: VC for proper sequencing to maintain back precautions during transfer. Pt able to complete without physical assist Transfers Transfers: Sit to Stand;Stand to Sit Sit to Stand: 4: Min assist;With upper extremity assist;From bed Stand to Sit: 4: Min assist;With upper extremity assist;To bed;4: Min guard Details for Transfer Assistance: VC for proper hand placement for a safe transfer to/from RW as well as cueing to maintain back precautions. Min assist for stability through LEs and pelvis into standing Ambulation/Gait Ambulation/Gait Assistance: 4:  Min guard;4: Min Environmental consultant (Feet): 300 Feet Assistive device: Rolling walker Ambulation/Gait Assistance Details: Min assist without RW for stability and support. Minguard with RW. VC for proper sequencing and safety with RW.  Gait Pattern: Step-to pattern;Antalgic Gait velocity: decreased gait speed Stairs: Yes Stairs Assistance: 4: Min assist Stairs Assistance Details (indicate cue type and reason): Min assist for stability with RW. Assisted pt and wife for safety upon d/c on stairs. Stair Management Technique: No rails;Backwards;With walker;Step to pattern Number of Stairs: 2     Exercises     PT Diagnosis: Difficulty walking;Acute pain  PT Problem List: Decreased activity tolerance;Decreased mobility;Decreased knowledge of use of DME;Decreased safety awareness;Decreased knowledge of precautions;Pain PT Treatment Interventions: DME instruction;Gait training;Stair training;Functional mobility training;Therapeutic activities;Patient/family education   PT Goals Acute Rehab PT Goals PT Goal Formulation: With patient Time For Goal Achievement: 09/18/11 Potential to Achieve Goals: Good Pt will Roll Supine to Right Side: with modified independence PT Goal: Rolling Supine to Right Side - Progress: Goal set today Pt will Roll Supine to Left Side: with modified independence PT Goal: Rolling Supine to Left Side - Progress: Goal set today Pt will go Supine/Side to Sit: with modified independence PT Goal: Supine/Side to Sit - Progress: Goal set today Pt will go Sit to Supine/Side: with modified independence PT Goal: Sit to Supine/Side - Progress: Goal set today Pt will go Sit to Stand: with modified independence PT Goal: Sit to Stand - Progress: Goal set today Pt will go Stand to Sit: with modified independence PT Goal: Stand to Sit - Progress: Goal set today Pt will Transfer Bed to Chair/Chair to Bed: with modified independence PT Transfer Goal: Bed  to Chair/Chair to Bed -  Progress: Goal set today Pt will Ambulate: >150 feet;with modified independence;with least restrictive assistive device PT Goal: Ambulate - Progress: Goal set today Pt will Go Up / Down Stairs: Flight;with supervision;with rail(s) PT Goal: Up/Down Stairs - Progress: Goal set today  Visit Information  Last PT Received On: 09/11/11 Assistance Needed: +1    Subjective Data  Patient Stated Goal: to go back to work   Prior Functioning  Home Living Lives With: Spouse;Other (Comment) (3 kids) Available Help at Discharge: Family;Other (Comment) (works part time; kids are always home) Type of Home: House Home Access: Stairs to enter [employer] of Steps: 2 Entrance Stairs-Rails: None Home Layout: Two level Alternate Level Stairs-Number of Steps: 13 Alternate Level Stairs-Rails: Can reach both Bathroom Shower/Tub: Walk-in shower;Door [employer] Toilet: Standard Bathroom Accessibility: Yes How Accessible: Accessible via walker Home Adaptive Equipment: Built-in shower seat;Reacher;Straight cane Prior Function Level of Independence: Independent Able to Take Stairs?: Yes Driving: Yes 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, sits at desk Communication Communication: No difficulties Dominant Hand: Right    Cognition  Overall Cognitive Status: Appears within functional limits for tasks assessed/performed Arousal/Alertness: Awake/alert Orientation Level: Appears intact for tasks assessed Behavior During Session: [facility] for tasks performed    Extremity/Trunk Assessment Right Lower Extremity Assessment RLE ROM/Strength/Tone: Within functional levels RLE Sensation: WFL - Light Touch Left Lower Extremity Assessment LLE ROM/Strength/Tone: Within functional levels LLE Sensation: WFL - Light Touch   Balance    End of Session PT - End of Session Equipment Utilized During Treatment: Gait belt Activity Tolerance: Patient tolerated treatment well Patient left: in  bed;with call bell/phone within reach;with family/visitor present Nurse Communication: Mobility status    Milana Kidney 09/11/2011, 4:09 PM 09/11/2011 Milana Kidney DPT PAGER: [phone] OFFICE: [phone]

## 2011-09-11 NOTE — Progress Notes (Signed)
Patient ID: Marcoantonio Legault, male   DOB: 12-31-1960, 51 y.o.   MRN: [id number] Trimox overall doing very well significant improvement in his leg pain he is having some numbness it appears to be an L5 suspicion includes his big toe he does have some slight weakness in his EHL this is been stable all days nonprogressive the numbness is bothering a part of the foot and he has no dorsiflexion weakness. Due to the setting of this being an isolated numbness and slight weakness of the EHL I think this represents an isolated radiculitis and the chances of it being a developing blood clot is extremely small at its been stable he had been on steroids prior surgery he was not on steroids postop possible this is masking some numbness he was feeling in addition with the setting of significant proven of his leg pain no other numbness or signs of cauda equina syndrome I would treat this overnight with steroids as long as it improves with part of discharge patient tomorrow if it progresses gets worse a total let us know we would that point we images spine. However due to the large nature of the disc herniation the extensive retraction in order to free up and remove the disc is not surprising that he has some swelling and irritation of the L5 nerve root.

## 2011-09-11 NOTE — Progress Notes (Signed)
Subjective: Patient reports Doing well has significantly improved leg pain has not ambulated yet is voiding  Objective: Vital signs in last 24 hours: Temp:  [97.8 F (36.6 C)-98.7 F (37.1 C)] 98.3 F (36.8 C) (08/15 0552) Pulse Rate:  [58-81] 58  (08/15 0552) Resp:  [12-20] 20  (08/15 0552) BP: (113-144)/(60-71) 113/66 mmHg (08/15 0552) SpO2:  [97 %-100 %] 99 % (08/15 0552)  Intake/Output from previous day: 08/14 0701 - 08/15 0700 In: 2150 [I.V.:2150] Out: 770 [Urine:720; Blood:50] Intake/Output this shift:    Strength is 5 out of 5 wound is clean and dry  Lab Results:  [facility] 09/10/11 2042  WBC --  HGB 13.6  HCT 40.0  PLT --   BMET  Basename 09/10/11 2042  NA 143  K 4.0  CL --  CO2 --  GLUCOSE 127*  BUN --  CREATININE --  CALCIUM --    Studies/Results: Dg Lumbar Spine 2-3 Views  09/10/2011  *RADIOLOGY REPORT*  Clinical Data: L4-L5 discectomy.  LUMBAR SPINE - 2-3 VIEW  Comparison: None.  Findings: Two intraoperative lateral spot films demonstrate intraoperative localization over the L5-S1 level.  On the second image, the soft tissue retractors are dorsal to the L5-S1 disc space and there appears to be a probe in the L5-S1 interspinous space.  This interpretation uses the MRI 09/08/2011 as a Publishing rights manager.  IMPRESSION: Intraoperative localization as above.  Original Report Authenticated By: Andreas Newport, M.D.    Assessment/Plan: Mobilized today with physical therapy this morning hospital discharged later that  LOS: 3 days     Tyron Manetta P 09/11/2011, 9:25 AM

## 2011-09-11 NOTE — ED Provider Notes (Signed)
Medical screening examination/treatment/procedure(s) were performed by non-physician practitioner and as supervising physician I was immediately available for consultation/collaboration.  Derwood Kaplan, MD 09/11/11 1709

## 2011-09-12 MED ORDER — OXYCODONE-ACETAMINOPHEN 5-325 MG PO TABS: 1.0000 | ORAL_TABLET | ORAL | Status: AC | PRN

## 2011-09-12 MED ORDER — BISACODYL 10 MG RE SUPP
10.0000 mg | Freq: Every day | RECTAL | Status: DC | PRN
  Administered 2011-09-12: 10 mg via RECTAL
  Filled 2011-09-12: qty 1

## 2011-09-12 MED ORDER — BISACODYL 10 MG RE SUPP: 10.0000 mg | Freq: Every day | RECTAL | Status: AC | PRN

## 2011-09-12 MED ORDER — CYCLOBENZAPRINE HCL 10 MG PO TABS: 10.0000 mg | ORAL_TABLET | Freq: Three times a day (TID) | ORAL | Status: AC | PRN

## 2011-09-12 NOTE — Progress Notes (Signed)
Patient ID: Joshua Huynh, male   DOB: March 31, 1960, 51 y.o.   MRN: [id number] Mr. much better significant permanent numbness in the right lower extremity improved pain ambulate well voiding spontaneous he still working on a bowel movement strength is 5 out of 5 still some slight weakness and EHL the) improved from yesterday is clean dry and

## 2011-09-12 NOTE — Progress Notes (Signed)
Physical Therapy Treatment Patient Details Name: Keelin Sheridan MRN: [id number] DOB: 22-Jan-1961 Today's Date: 09/12/2011 Time: 9147-8295 PT Time Calculation (min): 14 min  PT Assessment / Plan / Recommendation Comments on Treatment Session  Pt making great improvements, modified independent with all mobility, supervision only on stairs. All goals met, will not follow.    Follow Up Recommendations  No PT follow up;Supervision for mobility/OOB    Barriers to Discharge        Equipment Recommendations  Rolling walker with 5" wheels    Recommendations for Other Services    Frequency     Plan All goals met and education completed, patient dischaged from PT services    Precautions / Restrictions Precautions Precautions: Back Precaution Comments: pt able to verbalize and maintain 3/3 back precautions throughout session Restrictions Weight Bearing Restrictions: No   Pertinent Vitals/Pain pain 2/10    Mobility  Bed Mobility Bed Mobility: Rolling Left;Left Sidelying to Sit;Sitting - Scoot to [place] of Bed;Sit to Sidelying Left Rolling Left: 6: Modified independent (Device/Increase time) Left Sidelying to Sit: 6: Modified independent (Device/Increase time) Sitting - Scoot to Edge of Bed: 6: Modified independent (Device/Increase time) Sit to Sidelying Left: 6: Modified independent (Device/Increase time) Transfers Transfers: Sit to Stand;Stand to Sit Sit to Stand: 6: Modified independent (Device/Increase time);With upper extremity assist;From bed Stand to Sit: 6: Modified independent (Device/Increase time);With upper extremity assist;To bed Ambulation/Gait Ambulation/Gait Assistance: 6: Modified independent (Device/Increase time) Ambulation Distance (Feet): 800 Feet Assistive device: Rolling walker Ambulation/Gait Assistance Details: Pt with no gait deviations this sessions. Ambulated with RW for comfort Gait Pattern: Within Functional Limits Stairs: Yes Stairs Assistance: 5:  Supervision Stairs Assistance Details (indicate cue type and reason): Supervision for safety with pt and wife holding RW on stairs. Stair Management Technique: No rails;Backwards;With walker;Step to pattern Number of Stairs: 2       PT Goals Acute Rehab PT Goals PT Goal: Rolling Supine to Right Side - Progress: Met PT Goal: Rolling Supine to Left Side - Progress: Met PT Goal: Supine/Side to Sit - Progress: Met PT Goal: Sit to Supine/Side - Progress: Met PT Goal: Sit to Stand - Progress: Met PT Goal: Stand to Sit - Progress: Met PT Transfer Goal: Bed to Chair/Chair to Bed - Progress: Met PT Goal: Ambulate - Progress: Met PT Goal: Up/Down Stairs - Progress: Progressing toward goal  Visit Information  Last PT Received On: 09/12/11 Assistance Needed: +1    Subjective Data      Cognition  Overall Cognitive Status: Appears within functional limits for tasks assessed/performed Arousal/Alertness: Awake/alert Orientation Level: Appears intact for tasks assessed Behavior During Session: [facility] for tasks performed    Balance     End of Session PT - End of Session Equipment Utilized During Treatment: Gait belt Activity Tolerance: Patient tolerated treatment well Patient left: in bed;with call bell/phone within reach;with family/visitor present Nurse Communication: Mobility status     Milana Kidney 09/12/2011, 11:10 AM  09/12/2011 Milana Kidney DPT PAGER: [phone] OFFICE: [phone]

## 2011-09-12 NOTE — Discharge Summary (Signed)
  Physician Discharge Summary  Patient ID: Joshua Huynh MRN: [id number] DOB/AGE: 1960/04/16 51 y.o.  Admit date: 09/08/2011 Discharge date: 09/12/2011  Admission Diagnoses: Ruptured disc L4-5 and L5-S1  Discharge Diagnoses: Same Active Problems:  * No active hospital problems. *    Discharged Condition: good  Hospital Course: Patient admitted to the emergency apartment with severe back and right hip and leg pain MRI scan showed a very large ruptured disc at L4-5 as well as a secondary ruptured disc L5-S1 patient placed on IV steroids and there and felt very minimal improvement in his right leg pain and numbness the patient is a take the operating room underwent laminectomy discectomy L4-5 in the right as well as L5-S1 the right postop patient did very well significant room and the right leg pain it'll been numbness and L5 solution L5 radiculitis it responded and improved the time of discharge patient was angling and voiding spontaneously tolerating regular diet was he'll be discharged home scheduled followup approximately one 2 weeks the  Consults: Significant Diagnostic Studies: Treatments: L4-5 right laminectomy discectomy L5-S1 right laminectomy and discectomy Discharge Exam: Blood pressure 141/70, pulse 57, temperature 97.9 F (36.6 C), temperature source Oral, resp. rate 20, height 5\' 11"  (1.803 m), weight 80.74 kg (178 lb), SpO2 98.00%. Strength 5 out of 5 except slight weakness in the right EHL slight decreased sensation and L5 distributional otherwise intact  Disposition: Home   Medication List  As of 09/12/2011  7:56 AM   TAKE these medications         bisacodyl 10 MG suppository   Commonly known as: DULCOLAX   Place 1 suppository (10 mg total) rectally daily as needed.      CALCIUM PO   Take 1 tablet by mouth daily.      co-enzyme Q-10 50 MG capsule   Take 50 mg by mouth daily.      cyclobenzaprine 10 MG tablet   Commonly known as: FLEXERIL   Take 1 tablet (10  mg total) by mouth 3 (three) times daily as needed for muscle spasms.      ibuprofen 600 MG tablet   Commonly known as: ADVIL,MOTRIN   Take 600 mg by mouth every 6 (six) hours as needed. For pain      losartan 50 MG tablet   Commonly known as: COZAAR   Take 50 mg by mouth daily.      multivitamin with minerals Tabs   Take 1 tablet by mouth daily.      omega-3 acid ethyl esters 1 G capsule   Commonly known as: LOVAZA   Take 2 g by mouth 2 (two) times daily.      OSTEO ADVANCE PO   Take 1 tablet by mouth daily.      oxyCODONE-acetaminophen 5-325 MG per tablet   Commonly known as: PERCOCET/ROXICET   Take 1-2 tablets by mouth every 4 (four) hours as needed.             Signed: Jerlene Rockers P 09/12/2011, 7:56 AM

## 2011-09-12 NOTE — Progress Notes (Signed)
Physical Therapy Discharge Patient Details Name: [name] MRN: [id number] DOB: 01-19-1961 Today's Date: 09/12/2011 Time: 4098-1191 PT Time Calculation (min): 14 min  Patient discharged from PT services secondary to goals met and no further PT needs identified.  Please see latest therapy progress note for current level of functioning and progress toward goals.    Progress and discharge plan discussed with patient and/or caregiver: Patient/Caregiver agrees with plan   Joshua Huynh 09/12/2011, 11:11 AM

## 2013-04-14 ENCOUNTER — Encounter: Payer: Self-pay | Admitting: Internal Medicine

## 2013-05-23 ENCOUNTER — Ambulatory Visit (AMBULATORY_SURGERY_CENTER): Payer: Self-pay

## 2013-05-23 VITALS — Ht 71.0 in | Wt 175.0 lb

## 2013-05-23 DIAGNOSIS — Z1211 Encounter for screening for malignant neoplasm of colon: Secondary | ICD-10-CM

## 2013-05-23 MED ORDER — MOVIPREP 100 G PO SOLR: 1.0000 | Freq: Once | ORAL | Status: DC

## 2013-05-23 NOTE — Progress Notes (Signed)
No allergies to eggs or soy. No diet/weight loss meds. No home oxygen. Has email.  Emmi instructions given for colonoscopy. 

## 2013-05-27 ENCOUNTER — Encounter: Payer: Self-pay | Admitting: Internal Medicine

## 2013-06-06 ENCOUNTER — Encounter: Payer: Self-pay | Admitting: Internal Medicine

## 2013-06-06 ENCOUNTER — Ambulatory Visit (AMBULATORY_SURGERY_CENTER): Payer: 59 | Admitting: Internal Medicine

## 2013-06-06 VITALS — BP 117/74 | HR 61 | Temp 96.1°F | Resp 21 | Ht 71.0 in | Wt 175.0 lb

## 2013-06-06 DIAGNOSIS — Z1211 Encounter for screening for malignant neoplasm of colon: Secondary | ICD-10-CM

## 2013-06-06 DIAGNOSIS — D126 Benign neoplasm of colon, unspecified: Secondary | ICD-10-CM

## 2013-06-06 MED ORDER — SODIUM CHLORIDE 0.9 % IV SOLN: 500.0000 mL | INTRAVENOUS | Status: DC

## 2013-06-06 NOTE — Progress Notes (Signed)
No complaints noted in the recovery room. Maw   

## 2013-06-06 NOTE — Progress Notes (Signed)
Patient denies any allergies to eggs or soy. Patient denies any problems with anesthesia/sedation. Patient denies any oxygen use at home and does not take any diet/weight loss medications.  

## 2013-06-06 NOTE — Op Note (Signed)
[facility]  [address] [place], [id number]   COLONOSCOPY PROCEDURE REPORT  PATIENT: Joshua Huynh, Joshua Huynh  MR#: [id number] BIRTHDATE: 30-Jun-1960 , 53  yrs. old GENDER: Male ENDOSCOPIST: Eustace Quail, MD REFERRED EV:[id number] Tisovec, M.D. PROCEDURE DATE:  06/06/2013 PROCEDURE:   Colonoscopy with snare polypectomy x 1 First Screening Colonoscopy - Avg.  risk and is 50 yrs.  old or older Yes.  Prior Negative Screening - Now for repeat screening. N/A  History of Adenoma - Now for follow-up colonoscopy & has been > or = to 3 yrs.  N/A  Polyps Removed Today? Yes. ASA CLASS:   Class II INDICATIONS:average risk screening. MEDICATIONS: MAC sedation, administered by CRNA and propofol (Diprivan) 400mg  IV  DESCRIPTION OF PROCEDURE:   After the risks benefits and alternatives of the procedure were thoroughly explained, informed consent was obtained.  A digital rectal exam revealed no abnormalities of the rectum.   The [id number]  endoscope was introduced through the anus and advanced to the cecum, which was identified by both the appendix and ileocecal valve. No adverse events experienced.   The quality of the prep was excellent, using MoviPrep  The instrument was then slowly withdrawn as the colon was fully examined.   COLON FINDINGS: A sessile polyp measuring 5 mm in size was found in the ascending colon.  A polypectomy was performed with a cold snare.  The resection was complete and the polyp tissue was completely retrieved.   Mild diverticulosis was noted in the sigmoid colon.   The colon mucosa was otherwise normal. Retroflexed views revealed no abnormalities. The time to cecum=3 minutes 08 seconds.  Withdrawal time=13 minutes 26 seconds.  The scope was withdrawn and the procedure completed. COMPLICATIONS: There were no complications.  ENDOSCOPIC IMPRESSION: 1.   Sessile polyp measuring 5 mm in size was found in the ascending colon; polypectomy was  performed with a cold snare 2.   Mild diverticulosis was noted in the sigmoid colon 3.   The colon mucosa was otherwise normal  RECOMMENDATIONS: 1. Repeat colonoscopy in 5 years if polyp SSP or  adenomatous; otherwise 10 years   eSigned:  Eustace Quail, MD 06/06/2013 9:06 AM   cc: Domenick Gong, MD and The Patient

## 2013-06-06 NOTE — Patient Instructions (Signed)
YOU HAD AN ENDOSCOPIC PROCEDURE TODAY AT THE [facility]: Refer to the procedure report that was given to you for any specific questions about what was found during the examination.  If the procedure report does not answer your questions, please call your gastroenterologist to clarify.  If you requested that your care partner not be given the details of your procedure findings, then the procedure report has been included in a sealed envelope for you to review at your convenience later.  YOU SHOULD EXPECT: Some feelings of bloating in the abdomen. Passage of more gas than usual.  Walking can help get rid of the air that was put into your GI tract during the procedure and reduce the bloating. If you had a lower endoscopy (such as a colonoscopy or flexible sigmoidoscopy) you may notice spotting of blood in your stool or on the toilet paper. If you underwent a bowel prep for your procedure, then you may not have a normal bowel movement for a few days.  DIET: Your first meal following the procedure should be a light meal and then it is ok to progress to your normal diet.  A half-sandwich or bowl of soup is an example of a good first meal.  Heavy or fried foods are harder to digest and may make you feel nauseous or bloated.  Likewise meals heavy in dairy and vegetables can cause extra gas to form and this can also increase the bloating.  Drink plenty of fluids but you should avoid alcoholic beverages for 24 hours.  ACTIVITY: Your care partner should take you home directly after the procedure.  You should plan to take it easy, moving slowly for the rest of the day.  You can resume normal activity the day after the procedure however you should NOT DRIVE or use heavy machinery for 24 hours (because of the sedation medicines used during the test).    SYMPTOMS TO REPORT IMMEDIATELY: A gastroenterologist can be reached at any hour.  During normal business hours, 8:30 AM to 5:00 PM Monday through Friday,  call [phone].  After hours and on weekends, please call the GI answering service at [phone] who will take a message and have the physician on call contact you.   Following lower endoscopy (colonoscopy or flexible sigmoidoscopy):  Excessive amounts of blood in the stool  Significant tenderness or worsening of abdominal pains  Swelling of the abdomen that is new, acute  Fever of 100F or higher  FOLLOW UP: If any biopsies were taken you will be contacted by phone or by letter within the next 1-3 weeks.  Call your gastroenterologist if you have not heard about the biopsies in 3 weeks.  Our staff will call the home number listed on your records the next business day following your procedure to check on you and address any questions or concerns that you may have at that time regarding the information given to you following your procedure. This is a courtesy call and so if there is no answer at the home number and we have not heard from you through the emergency physician on call, we will assume that you have returned to your regular daily activities without incident.  SIGNATURES/CONFIDENTIALITY: You and/or your care partner have signed paperwork which will be entered into your electronic medical record.  These signatures attest to the fact that that the information above on your After Visit Summary has been reviewed and is understood.  Full responsibility of the confidentiality of this   discharge information lies with you and/or your care-partner.     Handouts were given to your care partner on polyps, diverticulosis, and a high fiber diet with liberal fluid intake. You may resume your current medications today. Await biopsy results. Please call if any questions or concerns.   

## 2013-06-07 ENCOUNTER — Telehealth: Payer: Self-pay | Admitting: *Deleted

## 2013-06-07 NOTE — Telephone Encounter (Signed)
  Follow up Call-  Call back number 06/06/2013  Post procedure Call Back phone  # [phone]  Permission to leave phone message Yes     Patient questions:  Do you have a fever, pain , or abdominal swelling? no Pain Score  0 *  Have you tolerated food without any problems? yes  Have you been able to return to your normal activities? yes  Do you have any questions about your discharge instructions: Diet   no Medications  no Follow up visit  no  Do you have questions or concerns about your Care? no  Actions: * If pain score is 4 or above: No action needed, pain <4.

## 2013-06-09 ENCOUNTER — Encounter: Payer: Self-pay | Admitting: Internal Medicine

## 2014-01-21 ENCOUNTER — Emergency Department (HOSPITAL_COMMUNITY): Payer: 59

## 2014-01-21 ENCOUNTER — Emergency Department (HOSPITAL_COMMUNITY)
Admission: EM | Admit: 2014-01-21 | Discharge: 2014-01-21 | Disposition: A | Discharge status: Home / Self Care | Payer: 59 | Attending: Emergency Medicine | Admitting: Emergency Medicine

## 2014-01-21 ENCOUNTER — Encounter (HOSPITAL_COMMUNITY): Payer: Self-pay | Admitting: Family Medicine

## 2014-01-21 DIAGNOSIS — R079 Chest pain, unspecified: Secondary | ICD-10-CM

## 2014-01-21 DIAGNOSIS — Z79899 Other long term (current) drug therapy: Secondary | ICD-10-CM | POA: Diagnosis not present

## 2014-01-21 DIAGNOSIS — M545 Low back pain, unspecified: Secondary | ICD-10-CM

## 2014-01-21 DIAGNOSIS — H40059 Ocular hypertension, unspecified eye: Secondary | ICD-10-CM | POA: Insufficient documentation

## 2014-01-21 DIAGNOSIS — I10 Essential (primary) hypertension: Secondary | ICD-10-CM | POA: Insufficient documentation

## 2014-01-21 DIAGNOSIS — Z7982 Long term (current) use of aspirin: Secondary | ICD-10-CM | POA: Insufficient documentation

## 2014-01-21 DIAGNOSIS — M549 Dorsalgia, unspecified: Secondary | ICD-10-CM | POA: Diagnosis present

## 2014-01-21 DIAGNOSIS — R9431 Abnormal electrocardiogram [ECG] [EKG]: Secondary | ICD-10-CM

## 2014-01-21 DIAGNOSIS — R109 Unspecified abdominal pain: Secondary | ICD-10-CM | POA: Diagnosis not present

## 2014-01-21 LAB — COMPREHENSIVE METABOLIC PANEL
ALK PHOS: 48 U/L (ref 39–117)
ALT: 32 U/L (ref 0–53)
AST: 32 U/L (ref 0–37)
Albumin: 4.3 g/dL (ref 3.5–5.2)
Anion gap: 7 (ref 5–15)
BUN: 15 mg/dL (ref 6–23)
CHLORIDE: 106 meq/L (ref 96–112)
CO2: 26 mmol/L (ref 19–32)
Calcium: 9.4 mg/dL (ref 8.4–10.5)
Creatinine, Ser: 1.07 mg/dL (ref 0.50–1.35)
GFR calc non Af Amer: 79 mL/min — ABNORMAL LOW (ref >90)
GLUCOSE: 105 mg/dL — AB (ref 70–99)
POTASSIUM: 4 mmol/L (ref 3.5–5.1)
SODIUM: 139 mmol/L (ref 135–145)
TOTAL PROTEIN: 7 g/dL (ref 6.0–8.3)
Total Bilirubin: 0.8 mg/dL (ref 0.3–1.2)

## 2014-01-21 LAB — CBC WITH DIFFERENTIAL/PLATELET
BASOS PCT: 0 % (ref 0–1)
Basophils Absolute: 0 10*3/uL (ref 0.0–0.1)
EOS ABS: 0 10*3/uL (ref 0.0–0.7)
Eosinophils Relative: 0 % (ref 0–5)
HCT: 39.5 % (ref 39.0–52.0)
HEMOGLOBIN: 13.4 g/dL (ref 13.0–17.0)
Lymphocytes Relative: 16 % (ref 12–46)
Lymphs Abs: 1.1 10*3/uL (ref 0.7–4.0)
MCH: 30.5 pg (ref 26.0–34.0)
MCHC: 33.9 g/dL (ref 30.0–36.0)
MCV: 90 fL (ref 78.0–100.0)
MONO ABS: 0.3 10*3/uL (ref 0.1–1.0)
MONOS PCT: 5 % (ref 3–12)
NEUTROS PCT: 79 % — AB (ref 43–77)
Neutro Abs: 5.3 10*3/uL (ref 1.7–7.7)
Platelets: 208 10*3/uL (ref 150–400)
RBC: 4.39 MIL/uL (ref 4.22–5.81)
RDW: 13.1 % (ref 11.5–15.5)
WBC: 6.8 10*3/uL (ref 4.0–10.5)

## 2014-01-21 LAB — URINALYSIS, ROUTINE W REFLEX MICROSCOPIC
BILIRUBIN URINE: NEGATIVE
GLUCOSE, UA: NEGATIVE mg/dL
HGB URINE DIPSTICK: NEGATIVE
Ketones, ur: 15 mg/dL — AB
Leukocytes, UA: NEGATIVE
Nitrite: NEGATIVE
PH: 5.5 (ref 5.0–8.0)
Protein, ur: NEGATIVE mg/dL
SPECIFIC GRAVITY, URINE: 1.01 (ref 1.005–1.030)
Urobilinogen, UA: 0.2 mg/dL (ref 0.0–1.0)

## 2014-01-21 LAB — LIPASE, BLOOD: Lipase: 23 U/L (ref 11–59)

## 2014-01-21 LAB — I-STAT TROPONIN, ED: TROPONIN I, POC: 0.01 ng/mL (ref 0.00–0.08)

## 2014-01-21 MED ORDER — OXYCODONE-ACETAMINOPHEN 5-325 MG PO TABS: 1.0000 | ORAL_TABLET | ORAL | Status: DC | PRN

## 2014-01-21 MED ORDER — METHOCARBAMOL 750 MG PO TABS: 750.0000 mg | ORAL_TABLET | Freq: Four times a day (QID) | ORAL | Status: DC

## 2014-01-21 MED ORDER — PANTOPRAZOLE SODIUM 20 MG PO TBEC: 20.0000 mg | DELAYED_RELEASE_TABLET | Freq: Every day | ORAL | Status: DC

## 2014-01-21 NOTE — ED Notes (Signed)
Ambulated to restroom  

## 2014-01-21 NOTE — ED Provider Notes (Signed)
CSN: [id number]     Arrival date & time 01/21/14  1252 History   First MD Initiated Contact with Patient 01/21/14 1328     Chief Complaint  Patient presents with  . Abdominal Pain  . Back Pain     (Consider location/radiation/quality/duration/timing/severity/associated sxs/prior Treatment) HPI Comments: Patient here complaining of abnormal EKG from urgent care. Went to urgent care today because of having left-sided flank pain 24 hours that was characterized as sharp and worse with movement and nonradiating. Low-grade temperature as well 2 without cough or congestion. Urinalysis noted from urgent care and was negative for infection. Patient has been having GERD type symptoms described as epigastric discomfort worse with drinking coffee and eating with increased flatus and burping. Patient had an EKG done which showed some abnormal T-wave inversions in leads 3 and aVF. Patient denies any chest pain or shortness of breath. Denies any diaphoresis. Denies any exercise intolerance. Was sent here for further evaluation  Patient is a 53 y.o. male presenting with abdominal pain and back pain. The history is provided by the patient.  Abdominal Pain Back Pain Associated symptoms: abdominal pain     Past Medical History  Diagnosis Date  . Hypertension   . Intraocular pressure increase    Past Surgical History  Procedure Laterality Date  . Lumbar laminectomy/decompression microdiscectomy  09/10/2011    Procedure: LUMBAR LAMINECTOMY/DECOMPRESSION MICRODISCECTOMY 1 LEVEL;  Surgeon: Elaina Hoops, MD;  Location: [facility];  Service: Neurosurgery;  Laterality: Right;  Right Lumbar Laminectomy/Decompression Microdiscectomy Lumbar Four-Five, Five-Sacral One  . Inguinal hernia repair      x2  . Ankle fracture surgery     Family History  Problem Relation Age of Onset  . Colon cancer Neg Hx   . Pancreatic cancer Neg Hx   . Stomach cancer Neg Hx   . Rectal cancer Neg Hx    History  Substance Use  Topics  . Smoking status: Never Smoker   . Smokeless tobacco: Never Used  . Alcohol Use: 3.0 oz/week    5 Glasses of wine per week     Comment: 5     Review of Systems  Gastrointestinal: Positive for abdominal pain.  Musculoskeletal: Positive for back pain.  All other systems reviewed and are negative.     Allergies  Review of patient's allergies indicates no known allergies.  Home Medications   Prior to Admission medications   Medication Sig Start Date End Date Taking? Authorizing Provider  aspirin 81 MG tablet Take 81 mg by mouth daily.   Yes Historical Provider, MD  Cholecalciferol (VITAMIN D3) 2000 UNITS TABS Take 2 tablets by mouth.   Yes Historical Provider, MD  Coenzyme Q10 (CO Q 10 PO) Take 300 mg by mouth daily.   Yes Historical Provider, MD  GLUCOSAMINE HCL-MSM PO Take 2 tablets by mouth.   Yes Historical Provider, MD  ibuprofen (ADVIL,MOTRIN) 600 MG tablet Take 600 mg by mouth every 6 (six) hours as needed. For pain   Yes Historical Provider, MD  Multiple Vitamin (MULTIVITAMIN WITH MINERALS) TABS Take 1 tablet by mouth daily.   Yes Historical Provider, MD  Nutritional Supplements (OSTEO ADVANCE PO) Take 1 tablet by mouth daily.   Yes Historical Provider, MD  olmesartan (BENICAR) 40 MG tablet Take 40 mg by mouth daily.   Yes Historical Provider, MD  OVER THE COUNTER MEDICATION 1,400 mg. Nature's Bounty Fish Oil   Yes Historical Provider, MD  OVER THE COUNTER MEDICATION Nature's Bounty Probiotic one daily  Yes Historical Provider, MD  travoprost, benzalkonium, (TRAVATAN) 0.004 % ophthalmic solution Place 1 drop into both eyes at bedtime.    Yes Historical Provider, MD  MOVIPREP 100 G SOLR Take 1 kit (200 g total) by mouth once. 05/23/13   Irene Shipper, MD   BP 143/82 mmHg  Pulse 52  Temp(Src) 97.9 F (36.6 C)  Resp 16  SpO2 100% Physical Exam  Constitutional: He is oriented to person, place, and time. He appears well-developed and well-nourished.  Non-toxic  appearance. No distress.  HENT:  Head: Normocephalic and atraumatic.  Eyes: Conjunctivae, EOM and lids are normal. Pupils are equal, round, and reactive to light.  Neck: Normal range of motion. Neck supple. No tracheal deviation present. No thyroid mass present.  Cardiovascular: Normal rate, regular rhythm and normal heart sounds.  Exam reveals no gallop.   No murmur heard. Pulmonary/Chest: Effort normal and breath sounds normal. No stridor. No respiratory distress. He has no decreased breath sounds. He has no wheezes. He has no rhonchi. He has no rales.  Abdominal: Soft. Normal appearance and bowel sounds are normal. He exhibits no distension. There is no tenderness. There is no rebound and no CVA tenderness.  Musculoskeletal: Normal range of motion. He exhibits no edema or tenderness.       Back:  Neurological: He is alert and oriented to person, place, and time. He has normal strength. No cranial nerve deficit or sensory deficit. GCS eye subscore is 4. GCS verbal subscore is 5. GCS motor subscore is 6.  Skin: Skin is warm and dry. No abrasion and no rash noted.  Psychiatric: He has a normal mood and affect. His speech is normal and behavior is normal.  Nursing note and vitals reviewed.   ED Course  Procedures (including critical care time) Labs Review Labs Reviewed  COMPREHENSIVE METABOLIC PANEL  CBC WITH DIFFERENTIAL  LIPASE, BLOOD  URINALYSIS, ROUTINE W REFLEX MICROSCOPIC  I-STAT TROPOININ, ED    Imaging Review No results found.   EKG Interpretation   Date/Time:  Saturday January 21 2014 13:09:45 EST Ventricular Rate:  51 PR Interval:  172 QRS Duration: 98 QT Interval:  454 QTC Calculation: 418 R Axis:   76 Text Interpretation:  Sinus bradycardia Otherwise normal ECG Confirmed by  Jomayra Novitsky  MD, Tobias Avitabile ([id number]) on 01/21/2014 2:30:33 PM      MDM   Final diagnoses:  Chest pain    Pt given referral to cardiology on call--no concern for acs, no cardiac arrythmia  sx--will place on PPI  Leota Jacobsen, MD 01/21/14 1534

## 2014-01-21 NOTE — ED Notes (Signed)
Pt here for epigastric pain radiating into back with nausea and slight fever. Sent here by [facility] with abnormal EKG.

## 2014-01-21 NOTE — ED Notes (Signed)
Pt here for epigastric pain and back pain,sent here from [facility] for ekg changes, on arrival here ekg displays sinus brady, wife at bedside.

## 2014-01-21 NOTE — Discharge Instructions (Signed)
Gastroesophageal Reflux Disease, Adult Gastroesophageal reflux disease (GERD) happens when acid from your stomach flows up into the esophagus. When acid comes in contact with the esophagus, the acid causes soreness (inflammation) in the esophagus. Over time, GERD may create small holes (ulcers) in the lining of the esophagus. CAUSES   Increased body weight. This puts pressure on the stomach, making acid rise from the stomach into the esophagus.  Smoking. This increases acid production in the stomach.  Drinking alcohol. This causes decreased pressure in the lower esophageal sphincter (valve or ring of muscle between the esophagus and stomach), allowing acid from the stomach into the esophagus.  Late evening meals and a full stomach. This increases pressure and acid production in the stomach.  A malformed lower esophageal sphincter. Sometimes, no cause is found. SYMPTOMS   Burning pain in the lower part of the mid-chest behind the breastbone and in the mid-stomach area. This may occur twice a week or more often.  Trouble swallowing.  Sore throat.  Dry cough.  Asthma-like symptoms including chest tightness, shortness of breath, or wheezing. DIAGNOSIS  Your caregiver may be able to diagnose GERD based on your symptoms. In some cases, X-rays and other tests may be done to check for complications or to check the condition of your stomach and esophagus. TREATMENT  Your caregiver may recommend over-the-counter or prescription medicines to help decrease acid production. Ask your caregiver before starting or adding any new medicines.  HOME CARE INSTRUCTIONS   Change the factors that you can control. Ask your caregiver for guidance concerning weight loss, quitting smoking, and alcohol consumption.  Avoid foods and drinks that make your symptoms worse, such as:  Caffeine or alcoholic drinks.  Chocolate.  Peppermint or mint flavorings.  Garlic and onions.  Spicy foods.  Citrus fruits,  such as oranges, lemons, or limes.  Tomato-based foods such as sauce, chili, salsa, and pizza.  Fried and fatty foods.  Avoid lying down for the 3 hours prior to your bedtime or prior to taking a nap.  Eat small, frequent meals instead of large meals.  Wear loose-fitting clothing. Do not wear anything tight around your waist that causes pressure on your stomach.  Raise the head of your bed 6 to 8 inches with wood blocks to help you sleep. Extra pillows will not help.  Only take over-the-counter or prescription medicines for pain, discomfort, or fever as directed by your caregiver.  Do not take aspirin, ibuprofen, or other nonsteroidal anti-inflammatory drugs (NSAIDs). SEEK IMMEDIATE MEDICAL CARE IF:   You have pain in your arms, neck, jaw, teeth, or back.  Your pain increases or changes in intensity or duration.  You develop nausea, vomiting, or sweating (diaphoresis).  You develop shortness of breath, or you faint.  Your vomit is green, yellow, black, or looks like coffee grounds or blood.  Your stool is red, bloody, or black. These symptoms could be signs of other problems, such as heart disease, gastric bleeding, or esophageal bleeding. MAKE SURE YOU:   Understand these instructions.  Will watch your condition.  Will get help right away if you are not doing well or get worse. Document Released: 10/23/2004 Document Revised: 04/07/2011 Document Reviewed: 08/02/2010 [facility] Patient Information 2015 [place], [place]. This information is not intended to replace advice given to you by your health care provider. Make sure you discuss any questions you have with your health care provider. Back Pain, Adult Low back pain is very common. About 1 in 5 people have back  pain.The cause of low back pain is rarely dangerous. The pain often gets better over time.About half of people with a sudden onset of back pain feel better in just 2 weeks. About 8 in 10 people feel better by 6 weeks.    CAUSES Some common causes of back pain include:  Strain of the muscles or ligaments supporting the spine.  Wear and tear (degeneration) of the spinal discs.  Arthritis.  Direct injury to the back. DIAGNOSIS Most of the time, the direct cause of low back pain is not known.However, back pain can be treated effectively even when the exact cause of the pain is unknown.Answering your caregiver's questions about your overall health and symptoms is one of the most accurate ways to make sure the cause of your pain is not dangerous. If your caregiver needs more information, he or she may order lab work or imaging tests (X-rays or MRIs).However, even if imaging tests show changes in your back, this usually does not require surgery. HOME CARE INSTRUCTIONS For many people, back pain returns.Since low back pain is rarely dangerous, it is often a condition that people can learn to [facility] their own.   Remain active. It is stressful on the back to sit or stand in one place. Do not sit, drive, or stand in one place for more than 30 minutes at a time. Take short walks on level surfaces as soon as pain allows.Try to increase the length of time you walk each day.  Do not stay in bed.Resting more than 1 or 2 days can delay your recovery.  Do not avoid exercise or work.Your body is made to move.It is not dangerous to be active, even though your back may hurt.Your back will likely heal faster if you return to being active before your pain is gone.  Pay attention to your body when you bend and lift. Many people have less discomfortwhen lifting if they bend their knees, keep the load close to their bodies,and avoid twisting. Often, the most comfortable positions are those that put less stress on your recovering back.  Find a comfortable position to sleep. Use a firm mattress and lie on your side with your knees slightly bent. If you lie on your back, put a pillow under your knees.  Only take  over-the-counter or prescription medicines as directed by your caregiver. Over-the-counter medicines to reduce pain and inflammation are often the most helpful.Your caregiver may prescribe muscle relaxant drugs.These medicines help dull your pain so you can more quickly return to your normal activities and healthy exercise.  Put ice on the injured area.  Put ice in a plastic bag.  Place a towel between your skin and the bag.  Leave the ice on for 15-20 minutes, 03-04 times a day for the first 2 to 3 days. After that, ice and heat may be alternated to reduce pain and spasms.  Ask your caregiver about trying back exercises and gentle massage. This may be of some benefit.  Avoid feeling anxious or stressed.Stress increases muscle tension and can worsen back pain.It is important to recognize when you are anxious or stressed and learn ways to manage it.Exercise is a great option. SEEK MEDICAL CARE IF:  You have pain that is not relieved with rest or medicine.  You have pain that does not improve in 1 week.  You have new symptoms.  You are generally not feeling well. SEEK IMMEDIATE MEDICAL CARE IF:   You have pain that radiates from your back  into your legs.  You develop new bowel or bladder control problems.  You have unusual weakness or numbness in your arms or legs.  You develop nausea or vomiting.  You develop abdominal pain.  You feel faint. Document Released: 01/13/2005 Document Revised: 07/15/2011 Document Reviewed: 05/17/2013 [facility] Patient Information 2015 [place], [place]. This information is not intended to replace advice given to you by your health care provider. Make sure you discuss any questions you have with your health care provider.

## 2014-01-27 DIAGNOSIS — R9439 Abnormal result of other cardiovascular function study: Secondary | ICD-10-CM

## 2014-01-27 HISTORY — DX: Abnormal result of other cardiovascular function study: R94.39

## 2014-01-31 ENCOUNTER — Encounter: Payer: Self-pay | Admitting: *Deleted

## 2014-02-02 ENCOUNTER — Ambulatory Visit (INDEPENDENT_AMBULATORY_CARE_PROVIDER_SITE_OTHER): Payer: 59 | Admitting: Cardiovascular Disease

## 2014-02-02 ENCOUNTER — Encounter: Payer: Self-pay | Admitting: Cardiovascular Disease

## 2014-02-02 ENCOUNTER — Ambulatory Visit (HOSPITAL_COMMUNITY): Payer: 59

## 2014-02-02 VITALS — BP 118/86 | HR 57 | Wt 176.0 lb

## 2014-02-02 DIAGNOSIS — R072 Precordial pain: Secondary | ICD-10-CM

## 2014-02-02 DIAGNOSIS — R9431 Abnormal electrocardiogram [ECG] [EKG]: Secondary | ICD-10-CM

## 2014-02-02 NOTE — Progress Notes (Addendum)
History of Present Illness: 54 yo male with history of HTN who is here today as a new patient for evaluation of chest pain. He was seen in the ED at [facility] 01/21/14 for c/o epigastric pain, worsened after eating. EKG with non-specific T wave changes. He tells me that he has no prior cardiac issues. He has chronic back issues. He strained his back on 01/21/14 and went to the ED. His chest pain then seemed to be reflux or referred pain from his back. He has had no exertional chest pain. He exercises every day. He denies any SOB. Overall feels well.   Primary Care Physician: T[facility]  Past Medical History  Diagnosis Date  . Hypertension   . Intraocular pressure increase     Past Surgical History  Procedure Laterality Date  . Lumbar laminectomy/decompression microdiscectomy  09/10/2011    Procedure: LUMBAR LAMINECTOMY/DECOMPRESSION MICRODISCECTOMY 1 LEVEL;  Surgeon: Elaina Hoops, MD;  Location: [place];  Service: Neurosurgery;  Laterality: Right;  Right Lumbar Laminectomy/Decompression Microdiscectomy Lumbar Four-Five, Five-Sacral One  . Inguinal hernia repair      x2  . Ankle fracture surgery      Current Outpatient Prescriptions  Medication Sig Dispense Refill  . aspirin 81 MG tablet Take 81 mg by mouth daily.    . Cholecalciferol (VITAMIN D3) 2000 UNITS TABS Take 2 tablets by mouth.    . Coenzyme Q10 (CO Q 10 PO) Take 300 mg by mouth daily.    Marland Kitchen GLUCOSAMINE HCL-MSM PO Take 2 tablets by mouth.    . magnesium oxide (MAG-OX) 400 MG tablet Take 400 mg by mouth daily. One table by mouth daily    . Multiple Vitamin (MULTIVITAMIN WITH MINERALS) TABS Take 1 tablet by mouth daily.    . Nutritional Supplements (OSTEO ADVANCE PO) Take 1 tablet by mouth daily.    Marland Kitchen olmesartan (BENICAR) 40 MG tablet Take 40 mg by mouth daily.    Marland Kitchen OVER THE COUNTER MEDICATION 1,400 mg. Nature's Bounty Fish Oil    . OVER THE COUNTER MEDICATION Nature's Bounty Probiotic one daily    . pantoprazole (PROTONIX) 20  MG tablet Take 1 tablet (20 mg total) by mouth daily. 30 tablet 0  . travoprost, benzalkonium, (TRAVATAN) 0.004 % ophthalmic solution Place 1 drop into both eyes at bedtime.     . Turmeric 500 MG CAPS Take 500 mg by mouth 2 (two) times daily. 2 by mouth daily     No current facility-administered medications for this visit.    No Known Allergies  History   Social History  . Marital Status: Married    Spouse Name: N/A    Number of Children: 3  . Years of Education: N/A   Occupational History  . Environmental consultant    Social History Main Topics  . Smoking status: Never Smoker   . Smokeless tobacco: Never Used  . Alcohol Use: 3.0 oz/week    5 Glasses of wine per week     Comment: 5   . Drug Use: No  . Sexual Activity: Not on file   Other Topics Concern  . Not on file   Social History Narrative    Family History  Problem Relation Age of Onset  . Colon cancer Neg Hx   . Pancreatic cancer Neg Hx   . Stomach cancer Neg Hx   . Rectal cancer Neg Hx   . Coronary artery disease Maternal Uncle   . Stroke Father   . Coronary  artery disease Brother     Review of Systems:  As stated in the HPI and otherwise negative.   BP 118/86 mmHg  Pulse 57  Wt 176 lb (79.833 kg)  SpO2 99%  Physical Examination: General: Well developed, well nourished, NAD HEENT: OP clear, mucus membranes moist SKIN: warm, dry. No rashes. Neuro: No focal deficits Musculoskeletal: Muscle strength 5/5 all ext Psychiatric: Mood and affect normal Neck: No JVD, no carotid bruits, no thyromegaly, no lymphadenopathy. Lungs:Clear bilaterally, no wheezes, rhonci, crackles Cardiovascular: Regular rate and rhythm. No murmurs, gallops or rubs. Abdomen:Soft. Bowel sounds present. Non-tender.  Extremities: No lower extremity edema. Pulses are 2 + in the bilateral DP/PT.  EKG: Sinus brady, rate 57 bpm.   Assessment and Plan:   1. Chest pain: Atypical. Not exertional. EKG shows flipped inferior T waves. Given family  history of CAD, personal history of HTN and abnormal EKG will arrange exercise stress test. Will arrange echo to assess LVEF.   2. Abnormal EKG: Cardiac testing as above.   Addendum: Stress test with ischemic EKG changes. Given clinical story, will arrange cardiac cath on 03/03/14 at [facility]. Risks and benefits reviewed with pt.   Alem Fahl 4:35 PM 02/27/2014

## 2014-02-02 NOTE — Patient Instructions (Signed)
Your physician recommends that you schedule a follow-up appointment in: 6-8 weeks.   Your physician has requested that you have an exercise tolerance test. For further information please visit [email or web address]. Please also follow instruction sheet, as given.   Your physician has requested that you have an echocardiogram. Echocardiography is a painless test that uses sound waves to create images of your heart. It provides your doctor with information about the size and shape of your heart and how well your heart's chambers and valves are working. This procedure takes approximately one hour. There are no restrictions for this procedure.

## 2014-02-16 ENCOUNTER — Telehealth (HOSPITAL_COMMUNITY): Payer: Self-pay

## 2014-02-16 NOTE — Telephone Encounter (Signed)
Encounter complete. 

## 2014-02-22 ENCOUNTER — Ambulatory Visit (HOSPITAL_BASED_OUTPATIENT_CLINIC_OR_DEPARTMENT_OTHER)
Admission: RE | Admit: 2014-02-22 | Discharge: 2014-02-22 | Disposition: A | Discharge status: Home / Self Care | Payer: 59 | Source: Ambulatory Visit | Attending: Cardiovascular Disease | Admitting: Cardiovascular Disease

## 2014-02-22 ENCOUNTER — Ambulatory Visit (HOSPITAL_COMMUNITY)
Admission: RE | Admit: 2014-02-22 | Discharge: 2014-02-22 | Disposition: A | Discharge status: Home / Self Care | Payer: 59 | Source: Ambulatory Visit | Attending: Cardiovascular Disease | Admitting: Cardiovascular Disease

## 2014-02-22 DIAGNOSIS — R072 Precordial pain: Secondary | ICD-10-CM

## 2014-02-22 DIAGNOSIS — I10 Essential (primary) hypertension: Secondary | ICD-10-CM | POA: Diagnosis not present

## 2014-02-22 DIAGNOSIS — R9431 Abnormal electrocardiogram [ECG] [EKG]: Secondary | ICD-10-CM

## 2014-02-22 DIAGNOSIS — R079 Chest pain, unspecified: Secondary | ICD-10-CM | POA: Diagnosis not present

## 2014-02-22 DIAGNOSIS — Z8249 Family history of ischemic heart disease and other diseases of the circulatory system: Secondary | ICD-10-CM | POA: Diagnosis not present

## 2014-02-22 NOTE — Procedures (Signed)
Exercise Treadmill Test  Pre-Exercise Testing Evaluation NSR, normal  Test  Exercise Tolerance Test Ordering MD: Murvin Natal, MD  Interpreting MD:   Unique Test No: 1 Treadmill:  1  Indication for ETT: chest pain - rule out ischemia  Contraindication to ETT: No   Stress Modality: exercise - treadmill  Cardiac Imaging Performed: non   Protocol: standard Bruce - maximal  Max BP:  170/76  Max MPHR (bpm):  169 85% MPR (bpm):  143  MPHR obtained (bpm):  162 % MPHR obtained:  95  Reached 85% MPHR (min:sec):  9:50 Total Exercise Time (min-sec):  12:00  Workload in METS:  13.70 Borg Scale:   Reason ETT Terminated:  fatigue    ST Segment Analysis At Rest: normal ST segments - no evidence of significant ST depression With Exercise: significant ischemic ST depression 1.5- 2 mm horizontal ST depression in V5-V6 and in inferior leads  Other Information Arrhythmia:  No Angina during ETT:  absent (0) Quality of ETT:  diagnostic  ETT Interpretation:  abnormal - evidence of ST depression consistent with ischemia  Comments: Good exercise tolerance. Questionable drop in systolic BP at peak exercise.  Recommendations: Further evaluation for coronary insufficiency.  Sanda Klein, MD, [facility] [phone] office [phone] pager

## 2014-02-22 NOTE — Progress Notes (Signed)
2D Echocardiogram Complete.  02/22/2014   Joshua Huynh [place], RDCS

## 2014-02-24 ENCOUNTER — Telehealth: Payer: Self-pay | Admitting: Cardiovascular Disease

## 2014-02-24 DIAGNOSIS — R9431 Abnormal electrocardiogram [ECG] [EKG]: Secondary | ICD-10-CM

## 2014-02-24 DIAGNOSIS — R072 Precordial pain: Secondary | ICD-10-CM

## 2014-02-24 NOTE — Telephone Encounter (Signed)
Pt rtn call to dr Angelena Form from last night

## 2014-02-24 NOTE — Telephone Encounter (Signed)
I spoke to him today and we discussed a cath. He wants to discuss with his wife this weekend. Will call us back Monday. cdm

## 2014-02-27 ENCOUNTER — Encounter: Payer: Self-pay | Admitting: *Deleted

## 2014-02-27 NOTE — Telephone Encounter (Signed)
Spoke with pt and he is available on March 03, 2014 for cath.  I spoke with cath lab and cath arranged for 7:30.  Will contact pt later today with instructions.

## 2014-02-27 NOTE — Telephone Encounter (Signed)
Spoke with pt and verbally went over cath instructions with him.  Will leave instructions at front desk for him to pick up when he comes in for lab work on February 28, 2014.  Follow up appt made for March 7,2016 at 2:00

## 2014-02-27 NOTE — Addendum Note (Signed)
Addended by: Lauree Chandler D on: 02/27/2014 04:39 PM   Modules accepted: Orders

## 2014-02-27 NOTE — Telephone Encounter (Signed)
New Message  Pt called ready to schedule cath. Please call to sch

## 2014-02-28 ENCOUNTER — Other Ambulatory Visit (INDEPENDENT_AMBULATORY_CARE_PROVIDER_SITE_OTHER): Payer: 59 | Admitting: *Deleted

## 2014-02-28 DIAGNOSIS — R9431 Abnormal electrocardiogram [ECG] [EKG]: Secondary | ICD-10-CM

## 2014-02-28 DIAGNOSIS — R072 Precordial pain: Secondary | ICD-10-CM

## 2014-02-28 NOTE — Addendum Note (Signed)
Addended by: Eulis Foster on: 02/28/2014 04:06 PM   Modules accepted: Orders

## 2014-03-01 LAB — BASIC METABOLIC PANEL
BUN: 24 mg/dL — AB (ref 6–23)
CO2: 29 mEq/L (ref 19–32)
Calcium: 9.7 mg/dL (ref 8.4–10.5)
Chloride: 105 mEq/L (ref 96–112)
Creatinine, Ser: 1.11 mg/dL (ref 0.40–1.50)
GFR: 73.99 mL/min (ref >60.00)
GLUCOSE: 95 mg/dL (ref 70–99)
Potassium: 4.5 mEq/L (ref 3.5–5.1)
SODIUM: 140 meq/L (ref 135–145)

## 2014-03-01 LAB — CBC WITH DIFFERENTIAL/PLATELET
Basophils Absolute: 0 10*3/uL (ref 0.0–0.1)
Basophils Relative: 0.2 % (ref 0.0–3.0)
EOS PCT: 1.4 % (ref 0.0–5.0)
Eosinophils Absolute: 0.1 10*3/uL (ref 0.0–0.7)
HCT: 40.1 % (ref 39.0–52.0)
HEMOGLOBIN: 14 g/dL (ref 13.0–17.0)
LYMPHS ABS: 1.8 10*3/uL (ref 0.7–4.0)
LYMPHS PCT: 23.4 % (ref 12.0–46.0)
MCHC: 34.9 g/dL (ref 30.0–36.0)
MCV: 88 fl (ref 78.0–100.0)
MONO ABS: 0.5 10*3/uL (ref 0.1–1.0)
Monocytes Relative: 7.2 % (ref 3.0–12.0)
NEUTROS ABS: 5.2 10*3/uL (ref 1.4–7.7)
Neutrophils Relative %: 67.8 % (ref 43.0–77.0)
Platelets: 246 10*3/uL (ref 150.0–400.0)
RBC: 4.56 Mil/uL (ref 4.22–5.81)
RDW: 13.1 % (ref 11.5–15.5)
WBC: 7.6 10*3/uL (ref 4.0–10.5)

## 2014-03-01 LAB — PROTIME-INR
INR: 1 ratio (ref 0.8–1.0)
PROTHROMBIN TIME: 11.2 s (ref 9.6–13.1)

## 2014-03-03 ENCOUNTER — Encounter (HOSPITAL_COMMUNITY): Payer: Self-pay | Admitting: Cardiovascular Disease

## 2014-03-03 ENCOUNTER — Encounter (HOSPITAL_COMMUNITY)
Admission: RE | Disposition: A | Discharge status: Home / Self Care | Payer: Self-pay | Source: Ambulatory Visit | Attending: Cardiovascular Disease

## 2014-03-03 ENCOUNTER — Ambulatory Visit (HOSPITAL_COMMUNITY)
Admission: RE | Admit: 2014-03-03 | Discharge: 2014-03-03 | Disposition: A | Discharge status: Home / Self Care | Payer: 59 | Source: Ambulatory Visit | Attending: Cardiovascular Disease | Admitting: Cardiovascular Disease

## 2014-03-03 DIAGNOSIS — I10 Essential (primary) hypertension: Secondary | ICD-10-CM | POA: Insufficient documentation

## 2014-03-03 DIAGNOSIS — Z79899 Other long term (current) drug therapy: Secondary | ICD-10-CM | POA: Diagnosis not present

## 2014-03-03 DIAGNOSIS — Z7982 Long term (current) use of aspirin: Secondary | ICD-10-CM | POA: Insufficient documentation

## 2014-03-03 DIAGNOSIS — R9439 Abnormal result of other cardiovascular function study: Secondary | ICD-10-CM | POA: Diagnosis present

## 2014-03-03 DIAGNOSIS — I25110 Atherosclerotic heart disease of native coronary artery with unstable angina pectoris: Secondary | ICD-10-CM | POA: Insufficient documentation

## 2014-03-03 DIAGNOSIS — R9431 Abnormal electrocardiogram [ECG] [EKG]: Secondary | ICD-10-CM

## 2014-03-03 DIAGNOSIS — R079 Chest pain, unspecified: Secondary | ICD-10-CM

## 2014-03-03 HISTORY — PX: CORONARY ANGIOGRAM: SHX5466

## 2014-03-03 SURGERY — CORONARY ANGIOGRAM

## 2014-03-03 MED ORDER — MIDAZOLAM HCL 2 MG/2ML IJ SOLN
INTRAMUSCULAR | Status: AC
  Filled 2014-03-03: qty 2

## 2014-03-03 MED ORDER — HEPARIN (PORCINE) IN NACL 2-0.9 UNIT/ML-% IJ SOLN
INTRAMUSCULAR | Status: AC
  Filled 2014-03-03: qty 1000

## 2014-03-03 MED ORDER — SODIUM CHLORIDE 0.9 % IJ SOLN: 3.0000 mL | INTRAMUSCULAR | Status: DC | PRN

## 2014-03-03 MED ORDER — NITROGLYCERIN 1 MG/10 ML FOR IR/CATH LAB
INTRA_ARTERIAL | Status: AC
  Filled 2014-03-03: qty 10

## 2014-03-03 MED ORDER — FENTANYL CITRATE 0.05 MG/ML IJ SOLN
INTRAMUSCULAR | Status: AC
  Filled 2014-03-03: qty 2

## 2014-03-03 MED ORDER — VERAPAMIL HCL 2.5 MG/ML IV SOLN
INTRAVENOUS | Status: AC
  Filled 2014-03-03: qty 2

## 2014-03-03 MED ORDER — SODIUM CHLORIDE 0.9 % IJ SOLN: 3.0000 mL | Freq: Two times a day (BID) | INTRAMUSCULAR | Status: DC

## 2014-03-03 MED ORDER — SODIUM CHLORIDE 0.9 % IV SOLN: INTRAVENOUS | Status: AC

## 2014-03-03 MED ORDER — ASPIRIN 81 MG PO CHEW
CHEWABLE_TABLET | ORAL | Status: AC
  Filled 2014-03-03: qty 1

## 2014-03-03 MED ORDER — HEPARIN SODIUM (PORCINE) 1000 UNIT/ML IJ SOLN
INTRAMUSCULAR | Status: AC
  Filled 2014-03-03: qty 1

## 2014-03-03 MED ORDER — LIDOCAINE HCL (PF) 1 % IJ SOLN
INTRAMUSCULAR | Status: AC
  Filled 2014-03-03: qty 30

## 2014-03-03 MED ORDER — SODIUM CHLORIDE 0.9 % IV SOLN
INTRAVENOUS | Status: DC
  Administered 2014-03-03: 07:00:00 via INTRAVENOUS

## 2014-03-03 MED ORDER — SODIUM CHLORIDE 0.9 % IV SOLN: 250.0000 mL | INTRAVENOUS | Status: DC | PRN

## 2014-03-03 MED ORDER — ASPIRIN 81 MG PO CHEW
81.0000 mg | CHEWABLE_TABLET | ORAL | Status: AC
  Administered 2014-03-03: 81 mg via ORAL

## 2014-03-03 NOTE — CV Procedure (Signed)
      Cardiac Catheterization Operative Report  Regan Llorente [id number] 2/5/20168:10 AM Haywood Pao, MD  Procedure Performed:  1. Selective Coronary Angiography  Operator: Lauree Chandler, MD  Arterial access site:  Right radial artery.   Indication:  54 yo male with history of HTN with recent chest pain. Exercise stress test with ST depression concerning for ischemia. Echo with normal LV function. Cardiac cath to exclude obstructive CAD.                                      Procedure Details: The risks, benefits, complications, treatment options, and expected outcomes were discussed with the patient. The patient and/or family concurred with the proposed plan, giving informed consent. The patient was brought to the cath lab after IV hydration was begun and oral premedication was given. The patient was further sedated with Versed and Fentanyl. The right wrist was assessed with a modified Allens test which was positive. The right wrist was prepped and draped in a sterile fashion. 1% lidocaine was used for local anesthesia. Using the modified Seldinger access technique, a 5 French sheath was placed in the right radial artery. 3 mg Verapamil was given through the sheath. 3500 units IV heparin was given. Standard diagnostic catheters were used to perform selective coronary angiography. He had intense spasm in his right radial artery following the selective angiography. He was given an additional 3 mg Verapamil through the sheath. I was unable to pass the pigtail catheter to the LV. No left heart cath performed. I did not feel that the data to be obtained from crossing the aortic valve was necessary given the risk of catheter complications with ongoing radial artery spasm. (LV function normal by echo last week). The sheath was removed from the right radial artery and a Terumo hemostasis band was applied at the arteriotomy site on the right wrist.   There were no immediate  complications. The patient was taken to the recovery area in stable condition.   Hemodynamic Findings: Central aortic pressure: 116/66  Angiographic Findings:  Left main: No obstructive disease.   Left Anterior Descending Artery: Large caliber vessel that courses to the apex. There is a 20% stenosis in the mid vessel just beyond the takeoff of a small diagonal branch. The first diagonal branch is a small to moderate caliber vessel with no obstructive disease.   Circumflex Artery: Large dominant vessel with large bifurcating obtuse marginal branch and moderate caliber posterolateral branch. No obstructive disease.   Right Coronary Artery: Small non-dominant vessel with no obstructive disease.   Left Ventricular Angiogram: Deferred (echo last week with normal LVEF)  Impression: 1. Mild non-obstructive CAD 2. Normal LV function by echo last week 3. 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 If he has recurrent episodes of chest pain, can consider Imdur or Norvasc for spasm.        Complications:  None. The patient tolerated the procedure well.

## 2014-03-03 NOTE — Interval H&P Note (Signed)
History and Physical Interval Note:  03/03/2014 7:40 AM  Joshua Huynh  has presented today for cardiac cath with the diagnosis of abnormal stress test, unstable angina.  The various methods of treatment have been discussed with the patient and family. After consideration of risks, benefits and other options for treatment, the patient has consented to  Procedure(s): LEFT HEART CATHETERIZATION WITH CORONARY ANGIOGRAM (N/A) as a surgical intervention .  The patient's history has been reviewed, patient examined, no change in status, stable for surgery.  I have reviewed the patient's chart and labs.  Questions were answered to the patient's satisfaction.    Cath Lab Visit (complete for each Cath Lab visit)  Clinical Evaluation Leading to the Procedure:   ACS: No.  Non-ACS:    Anginal Classification: CCS II  Anti-ischemic medical therapy: No Therapy  Non-Invasive Test Results: Intermediate-risk stress test findings: cardiac mortality 1-3%/year  Prior CABG: No previous CABG         Joshua Huynh

## 2014-03-03 NOTE — H&P (View-Only) (Signed)
History of Present Illness: 54 yo male with history of HTN who is here today as a new patient for evaluation of chest pain. He was seen in the ED at [facility] 01/21/14 for c/o epigastric pain, worsened after eating. EKG with non-specific T wave changes. He tells me that he has no prior cardiac issues. He has chronic back issues. He strained his back on 01/21/14 and went to the ED. His chest pain then seemed to be reflux or referred pain from his back. He has had no exertional chest pain. He exercises every day. He denies any SOB. Overall feels well.   Primary Care Physician: Tisovec  Past Medical History  Diagnosis Date  . Hypertension   . Intraocular pressure increase     Past Surgical History  Procedure Laterality Date  . Lumbar laminectomy/decompression microdiscectomy  09/10/2011    Procedure: LUMBAR LAMINECTOMY/DECOMPRESSION MICRODISCECTOMY 1 LEVEL;  Surgeon: Elaina Hoops, MD;  Location: [facility];  Service: Neurosurgery;  Laterality: Right;  Right Lumbar Laminectomy/Decompression Microdiscectomy Lumbar Four-Five, Five-Sacral One  . Inguinal hernia repair      x2  . Ankle fracture surgery      Current Outpatient Prescriptions  Medication Sig Dispense Refill  . aspirin 81 MG tablet Take 81 mg by mouth daily.    . Cholecalciferol (VITAMIN D3) 2000 UNITS TABS Take 2 tablets by mouth.    . Coenzyme Q10 (CO Q 10 PO) Take 300 mg by mouth daily.    Marland Kitchen GLUCOSAMINE HCL-MSM PO Take 2 tablets by mouth.    . magnesium oxide (MAG-OX) 400 MG tablet Take 400 mg by mouth daily. One table by mouth daily    . Multiple Vitamin (MULTIVITAMIN WITH MINERALS) TABS Take 1 tablet by mouth daily.    . Nutritional Supplements (OSTEO ADVANCE PO) Take 1 tablet by mouth daily.    Marland Kitchen olmesartan (BENICAR) 40 MG tablet Take 40 mg by mouth daily.    Marland Kitchen OVER THE COUNTER MEDICATION 1,400 mg. Nature's Bounty Fish Oil    . OVER THE COUNTER MEDICATION Nature's Bounty Probiotic one daily    . pantoprazole (PROTONIX) 20  MG tablet Take 1 tablet (20 mg total) by mouth daily. 30 tablet 0  . travoprost, benzalkonium, (TRAVATAN) 0.004 % ophthalmic solution Place 1 drop into both eyes at bedtime.     . Turmeric 500 MG CAPS Take 500 mg by mouth 2 (two) times daily. 2 by mouth daily     No current facility-administered medications for this visit.    No Known Allergies  History   Social History  . Marital Status: Married    Spouse Name: N/A    Number of Children: 3  . Years of Education: N/A   Occupational History  . Environmental consultant    Social History Main Topics  . Smoking status: Never Smoker   . Smokeless tobacco: Never Used  . Alcohol Use: 3.0 oz/week    5 Glasses of wine per week     Comment: 5   . Drug Use: No  . Sexual Activity: Not on file   Other Topics Concern  . Not on file   Social History Narrative    Family History  Problem Relation Age of Onset  . Colon cancer Neg Hx   . Pancreatic cancer Neg Hx   . Stomach cancer Neg Hx   . Rectal cancer Neg Hx   . Coronary artery disease Maternal Uncle   . Stroke Father   . Coronary  artery disease Brother     Review of Systems:  As stated in the HPI and otherwise negative.   BP 118/86 mmHg  Pulse 57  Wt 176 lb (79.833 kg)  SpO2 99%  Physical Examination: General: Well developed, well nourished, NAD HEENT: OP clear, mucus membranes moist SKIN: warm, dry. No rashes. Neuro: No focal deficits Musculoskeletal: Muscle strength 5/5 all ext Psychiatric: Mood and affect normal Neck: No JVD, no carotid bruits, no thyromegaly, no lymphadenopathy. Lungs:Clear bilaterally, no wheezes, rhonci, crackles Cardiovascular: Regular rate and rhythm. No murmurs, gallops or rubs. Abdomen:Soft. Bowel sounds present. Non-tender.  Extremities: No lower extremity edema. Pulses are 2 + in the bilateral DP/PT.  EKG: Sinus brady, rate 57 bpm.   Assessment and Plan:   1. Chest pain: Atypical. Not exertional. EKG shows flipped inferior T waves. Given family  history of CAD, personal history of HTN and abnormal EKG will arrange exercise stress test. Will arrange echo to assess LVEF.   2. Abnormal EKG: Cardiac testing as above.   Addendum: Stress test with ischemic EKG changes. Given clinical story, will arrange cardiac cath on 03/03/14 at [facility]. Risks and benefits reviewed with pt.   MCALHANY,CHRISTOPHER 4:35 PM 02/27/2014

## 2014-03-03 NOTE — Discharge Instructions (Signed)
Radial Site Care °Refer to this sheet in the next few weeks. These instructions provide you with information on caring for yourself after your procedure. Your caregiver may also give you more specific instructions. Your treatment has been planned according to current medical practices, but problems sometimes occur. Call your caregiver if you have any problems or questions after your procedure. °HOME CARE INSTRUCTIONS °· You may shower the day after the procedure. Remove the bandage (dressing) and gently wash the site with plain soap and water. Gently pat the site dry. °· Do not apply powder or lotion to the site. °· Do not submerge the affected site in water for 3 to 5 days. °· Inspect the site at least twice daily. °· Do not flex or bend the affected arm for 24 hours. °· No lifting over 5 pounds (2.3 kg) for 5 days after your procedure. °· Do not drive home if you are discharged the same day of the procedure. Have someone else drive you. °· You may drive 24 hours after the procedure unless otherwise instructed by your caregiver. °· Do not operate machinery or power tools for 24 hours. °· A responsible adult should be with you for the first 24 hours after you arrive home. °What to expect: °· Any bruising will usually fade within 1 to 2 weeks. °· Blood that collects in the tissue (hematoma) may be painful to the touch. It should usually decrease in size and tenderness within 1 to 2 weeks. °SEEK IMMEDIATE MEDICAL CARE IF: °· You have unusual pain at the radial site. °· You have redness, warmth, swelling, or pain at the radial site. °· You have drainage (other than a small amount of blood on the dressing). °· You have chills. °· You have a fever or persistent symptoms for more than 72 hours. °· You have a fever and your symptoms suddenly get worse. °· Your arm becomes pale, cool, tingly, or numb. °· You have heavy bleeding from the site. Hold pressure on the site and call 911. °Document Released: 02/15/2010 Document  Revised: 04/07/2011 Document Reviewed: 02/15/2010 °ExitCare® Patient Information ©2015 [employer]. This information is not intended to replace advice given to you by your health care provider. Make sure you discuss any questions you have with your health care provider. ° °

## 2014-03-21 ENCOUNTER — Other Ambulatory Visit: Payer: Self-pay | Admitting: Cardiovascular Disease

## 2014-03-22 NOTE — Telephone Encounter (Signed)
I do not see where Dr Angelena Form has ever refilled this. Ok to refill or should this be deferred to pcp? Please advise. Thanks, MI

## 2014-03-22 NOTE — Telephone Encounter (Signed)
OK to refill.  Looks like it was started in ED

## 2014-04-03 ENCOUNTER — Ambulatory Visit: Payer: 59 | Admitting: Cardiovascular Disease

## 2014-04-07 ENCOUNTER — Ambulatory Visit (INDEPENDENT_AMBULATORY_CARE_PROVIDER_SITE_OTHER): Payer: 59 | Admitting: Cardiovascular Disease

## 2014-04-07 ENCOUNTER — Encounter: Payer: Self-pay | Admitting: Cardiovascular Disease

## 2014-04-07 VITALS — BP 138/86 | HR 60 | Ht 71.0 in | Wt 179.8 lb

## 2014-04-07 DIAGNOSIS — I2510 Atherosclerotic heart disease of native coronary artery without angina pectoris: Secondary | ICD-10-CM

## 2014-04-07 NOTE — Patient Instructions (Signed)
Your physician wants you to follow-up in:  12 months.  You will receive a reminder letter in the mail two months in advance. If you don't receive a letter, please call our office to schedule the follow-up appointment.   

## 2014-04-07 NOTE — Progress Notes (Signed)
CC: cath follow up  History of Present Illness: 54 yo male with history of HTN who is here today for cardiac follow up. I saw him 02/02/14 as a new patient for evaluation of chest pain. He was seen in the ED at [facility] 01/21/14 for c/o epigastric pain, worsened after eating. EKG with non-specific T wave changes. He tells me that he has no prior cardiac issues. He has chronic back issues. He strained his back on 01/21/14 and went to the ED. His chest pain then seemed to be reflux or referred pain from his back. He has had no exertional chest pain. He exercises every day. He denies any SOB. Overall feels well. Cardiac cath 03/03/14 with mild CAD. Echo with normal LV function.   He is here today for follow up. He is feeling well. No chest pain or SOB.   Primary Care Physician: Tisovec  Past Medical History  Diagnosis Date  . Hypertension   . Intraocular pressure increase     Past Surgical History  Procedure Laterality Date  . Lumbar laminectomy/decompression microdiscectomy  09/10/2011    Procedure: LUMBAR LAMINECTOMY/DECOMPRESSION MICRODISCECTOMY 1 LEVEL;  Surgeon: Elaina Hoops, MD;  Location: [facility];  Service: Neurosurgery;  Laterality: Right;  Right Lumbar Laminectomy/Decompression Microdiscectomy Lumbar Four-Five, Five-Sacral One  . Inguinal hernia repair      x2  . Ankle fracture surgery    . Coronary angiogram  03/03/2014    Procedure: CORONARY ANGIOGRAM;  Surgeon: Burnell Blanks, MD;  Location: [facility] CATH LAB;  Service: Cardiovascular;;    Current Outpatient Prescriptions  Medication Sig Dispense Refill  . aspirin EC 81 MG tablet Take 81 mg by mouth 2 (two) times daily.     Marland Kitchen atorvastatin (LIPITOR) 10 MG tablet Take 10 mg by mouth at bedtime.  11  . Cholecalciferol (VITAMIN D3) 2000 UNITS TABS Take 2,000 Units by mouth daily.     . magnesium oxide (MAG-OX) 400 MG tablet Take 400 mg by mouth daily.     . Misc Natural Products (GLUCOSAMINE CHONDROITIN MSM) TABS Take 2 tablets  by mouth daily.    . Multiple Vitamin (MULTIVITAMIN WITH MINERALS) TABS Take 1 tablet by mouth daily.    Marland Kitchen NITROSTAT 0.4 MG SL tablet   11  . olmesartan (BENICAR) 40 MG tablet Take 40 mg by mouth daily.    . Probiotic Product (PROBIOTIC PO) Take 1 tablet by mouth daily.    . travoprost, benzalkonium, (TRAVATAN) 0.004 % ophthalmic solution Place 1 drop into both eyes at bedtime.     . Turmeric 500 MG CAPS Take 500 mg by mouth 2 (two) times daily.     . pantoprazole (PROTONIX) 20 MG tablet TAKE 1 TABLET BY MOUTH DAILY (Patient not taking: Reported on 04/07/2014) 30 tablet 5   No current facility-administered medications for this visit.    No Known Allergies  History   Social History  . Marital Status: Married    Spouse Name: N/A  . Number of Children: 3  . Years of Education: N/A   Occupational History  . Environmental consultant    Social History Main Topics  . Smoking status: Never Smoker   . Smokeless tobacco: Never Used  . Alcohol Use: 3.0 oz/week    5 Glasses of wine per week     Comment: 5   . Drug Use: No  . Sexual Activity: Not on file   Other Topics Concern  . Not on file   Social History  Narrative    Family History  Problem Relation Age of Onset  . Colon cancer Neg Hx   . Pancreatic cancer Neg Hx   . Stomach cancer Neg Hx   . Rectal cancer Neg Hx   . Coronary artery disease Maternal Uncle   . Stroke Father   . Diabetes Father   . Coronary artery disease Brother   . Hypertension Mother   . Hyperlipidemia Mother   . Heart failure Mother     Review of Systems:  As stated in the HPI and otherwise negative.   BP 138/86 mmHg  Pulse 60  Ht 5\' 11"  (1.803 m)  Wt 179 lb 12.8 oz (81.557 kg)  BMI 25.09 kg/m2  SpO2 97%  Physical Examination: General: Well developed, well nourished, NAD HEENT: OP clear, mucus membranes moist SKIN: warm, dry. No rashes. Neuro: No focal deficits Musculoskeletal: Muscle strength 5/5 all ext Psychiatric: Mood and affect normal Neck: No  JVD, no carotid bruits, no thyromegaly, no lymphadenopathy. Lungs:Clear bilaterally, no wheezes, rhonci, crackles Cardiovascular: Regular rate and rhythm. No murmurs, gallops or rubs. Abdomen:Soft. Bowel sounds present. Non-tender.  Extremities: No lower extremity edema. Pulses are 2 + in the bilateral DP/PT.  Echo 02/22/14: Left ventricle: The cavity size was normal. Systolic function was normal. The estimated ejection fraction was in the range of 55% to 60%. Wall motion was normal; there were no regional wall motion abnormalities. Left ventricular diastolic function parameters were normal.  Cardiac cath 03/03/14: Left main: No obstructive disease.  Left Anterior Descending Artery: Large caliber vessel that courses to the apex. There is a 20% stenosis in the mid vessel just beyond the takeoff of a small diagonal branch. The first diagonal branch is a small to moderate caliber vessel with no obstructive disease.  Circumflex Artery: Large dominant vessel with large bifurcating obtuse marginal branch and moderate caliber posterolateral branch. No obstructive disease.  Right Coronary Artery: Small non-dominant vessel with no obstructive disease.  Left Ventricular Angiogram: Deferred (echo last week with normal LVEF)   Assessment and Plan:   1. CAD: Mild CAD by cath 03/03/14 (20% mid LAD stenosis). Will continue ASA and statin.    Follow up one year.    MCALHANY,CHRISTOPHER 12:09 PM 04/07/2014

## 2014-04-19 ENCOUNTER — Other Ambulatory Visit: Payer: Self-pay

## 2014-04-19 MED ORDER — ATORVASTATIN CALCIUM 10 MG PO TABS: 10.0000 mg | ORAL_TABLET | Freq: Every day | ORAL | Status: DC

## 2014-04-24 ENCOUNTER — Ambulatory Visit: Payer: 59 | Admitting: Cardiovascular Disease

## 2015-05-14 ENCOUNTER — Encounter: Payer: Self-pay | Admitting: Cardiovascular Disease

## 2015-05-14 ENCOUNTER — Ambulatory Visit (INDEPENDENT_AMBULATORY_CARE_PROVIDER_SITE_OTHER): Payer: 59 | Admitting: Cardiovascular Disease

## 2015-05-14 VITALS — BP 118/78 | HR 59 | Ht 71.0 in | Wt 179.4 lb

## 2015-05-14 DIAGNOSIS — I2510 Atherosclerotic heart disease of native coronary artery without angina pectoris: Secondary | ICD-10-CM | POA: Diagnosis not present

## 2015-05-14 MED ORDER — ATORVASTATIN CALCIUM 10 MG PO TABS: 10.0000 mg | ORAL_TABLET | Freq: Every day | ORAL | Status: DC

## 2015-05-14 NOTE — Patient Instructions (Signed)

## 2015-05-14 NOTE — Progress Notes (Signed)
Chief Complaint  Patient presents with  . Follow-up  . Coronary Artery Disease     History of Present Illness: 55 yo male with history of CAD and HTN who is here today for cardiac follow up. I saw him 02/02/14 as a new patient for evaluation of chest pain after an ED visit. He has chronic back issues. Cardiac cath 03/03/14 with mild CAD. Echo January 2016 with normal LV function.   He is here today for follow up. He is feeling well. No chest pain or SOB.   Primary Care Physician: Haywood Pao, MD  Past Medical History  Diagnosis Date  . Hypertension   . Intraocular pressure increase     Past Surgical History  Procedure Laterality Date  . Lumbar laminectomy/decompression microdiscectomy  09/10/2011    Procedure: LUMBAR LAMINECTOMY/DECOMPRESSION MICRODISCECTOMY 1 LEVEL;  Surgeon: Elaina Hoops, MD;  Location: [facility];  Service: Neurosurgery;  Laterality: Right;  Right Lumbar Laminectomy/Decompression Microdiscectomy Lumbar Four-Five, Five-Sacral One  . Inguinal hernia repair      x2  . Ankle fracture surgery    . Coronary angiogram  03/03/2014    Procedure: CORONARY ANGIOGRAM;  Surgeon: Burnell Blanks, MD;  Location: [facility] CATH LAB;  Service: Cardiovascular;;    Current Outpatient Prescriptions  Medication Sig Dispense Refill  . aspirin EC 81 MG tablet Take 81 mg by mouth 2 (two) times daily.     Marland Kitchen atorvastatin (LIPITOR) 10 MG tablet Take 1 tablet (10 mg total) by mouth at bedtime. 90 tablet 3  . Cholecalciferol (VITAMIN D3) 2000 UNITS TABS Take 2,000 Units by mouth daily.     . magnesium oxide (MAG-OX) 400 MG tablet Take 400 mg by mouth daily.     . Misc Natural Products (GLUCOSAMINE CHONDROITIN MSM) TABS Take 2 tablets by mouth daily.    . Multiple Vitamin (MULTIVITAMIN WITH MINERALS) TABS Take 1 tablet by mouth daily.    Marland Kitchen NITROSTAT 0.4 MG SL tablet Place 0.4 mg under the tongue every 5 (five) minutes as needed for chest pain. Max 3  11  . olmesartan (BENICAR) 40 MG  tablet Take 40 mg by mouth daily.    . Probiotic Product (PROBIOTIC PO) Take 1 tablet by mouth daily.    . travoprost, benzalkonium, (TRAVATAN) 0.004 % ophthalmic solution Place 1 drop into both eyes at bedtime.     . Turmeric 500 MG CAPS Take 500 mg by mouth 2 (two) times daily.      No current facility-administered medications for this visit.    No Known Allergies  Social History   Social History  . Marital Status: Married    Spouse Name: N/A  . Number of Children: 3  . Years of Education: N/A   Occupational History  . Environmental consultant    Social History Main Topics  . Smoking status: Never Smoker   . Smokeless tobacco: Never Used  . Alcohol Use: 3.0 oz/week    5 Glasses of wine per week     Comment: 5   . Drug Use: No  . Sexual Activity: Not on file   Other Topics Concern  . Not on file   Social History Narrative    Family History  Problem Relation Age of Onset  . Colon cancer Neg Hx   . Pancreatic cancer Neg Hx   . Stomach cancer Neg Hx   . Rectal cancer Neg Hx   . Coronary artery disease Maternal Uncle   . Stroke Father   .  Diabetes Father   . Coronary artery disease Brother   . Hypertension Mother   . Hyperlipidemia Mother   . Heart failure Mother     Review of Systems:  As stated in the HPI and otherwise negative.   BP 118/78 mmHg  Pulse 59  Ht 5\' 11"  (1.803 m)  Wt 179 lb 6.4 oz (81.375 kg)  BMI 25.03 kg/m2  Physical Examination: General: Well developed, well nourished, NAD HEENT: OP clear, mucus membranes moist SKIN: warm, dry. No rashes. Neuro: No focal deficits Musculoskeletal: Muscle strength 5/5 all ext Psychiatric: Mood and affect normal Neck: No JVD, no carotid bruits, no thyromegaly, no lymphadenopathy. Lungs:Clear bilaterally, no wheezes, rhonci, crackles Cardiovascular: Regular rate and rhythm. No murmurs, gallops or rubs. Abdomen:Soft. Bowel sounds present. Non-tender.  Extremities: No lower extremity edema. Pulses are 2 + in the  bilateral DP/PT.  Echo 02/22/14: Left ventricle: The cavity size was normal. Systolic function was normal. The estimated ejection fraction was in the range of 55% to 60%. Wall motion was normal; there were no regional wall motion abnormalities. Left ventricular diastolic function parameters were normal.  Cardiac cath 03/03/14: Left main: No obstructive disease.  Left Anterior Descending Artery: Large caliber vessel that courses to the apex. There is a 20% stenosis in the mid vessel just beyond the takeoff of a small diagonal branch. The first diagonal branch is a small to moderate caliber vessel with no obstructive disease.  Circumflex Artery: Large dominant vessel with large bifurcating obtuse marginal branch and moderate caliber posterolateral branch. No obstructive disease.  Right Coronary Artery: Small non-dominant vessel with no obstructive disease.  Left Ventricular Angiogram: Deferred (echo last week with normal LVEF)  EKG:  EKG is ordered today. The ekg ordered today demonstrates sinus brady, rate 59 bpm.   Recent Labs: No results found for requested labs within last 365 days.   Lipid Panel No results found for: CHOL, TRIG, HDL, CHOLHDL, VLDL, LDLCALC, LDLDIRECT   Wt Readings from Last 3 Encounters:  05/14/15 179 lb 6.4 oz (81.375 kg)  04/07/14 179 lb 12.8 oz (81.557 kg)  03/03/14 171 lb (77.565 kg)     Other studies Reviewed: Additional studies/ records that were reviewed today include: . Review of the above records demonstrates:    Assessment and Plan:   1. CAD: Mild CAD by cath 03/03/14 (20% mid LAD stenosis). Will continue ASA and statin. Lipids are followed in primary care.   Current medicines are reviewed at length with the patient today.  The patient does not have concerns regarding medicines.  The following changes have been made:  no change  Labs/ tests ordered today include:   Orders Placed This Encounter  Procedures  . EKG 12-Lead     Disposition:   FU with me in 12  months  Signed, Lauree Chandler, MD 05/14/2015 11:07 AM    [facility] [place], [place], [place]  [address] Phone: [phone]; Fax: [phone]

## 2016-04-15 ENCOUNTER — Other Ambulatory Visit: Payer: Self-pay | Admitting: Cardiovascular Disease

## 2016-05-16 ENCOUNTER — Encounter: Payer: Self-pay | Admitting: Cardiovascular Disease

## 2016-06-09 ENCOUNTER — Encounter: Payer: Self-pay | Admitting: Cardiovascular Disease

## 2016-06-09 ENCOUNTER — Ambulatory Visit (INDEPENDENT_AMBULATORY_CARE_PROVIDER_SITE_OTHER): Payer: 59 | Admitting: Cardiovascular Disease

## 2016-06-09 VITALS — BP 120/80 | HR 64 | Ht 70.0 in | Wt 167.8 lb

## 2016-06-09 DIAGNOSIS — I2510 Atherosclerotic heart disease of native coronary artery without angina pectoris: Secondary | ICD-10-CM | POA: Diagnosis not present

## 2016-06-09 MED ORDER — ATORVASTATIN CALCIUM 10 MG PO TABS: 10.0000 mg | ORAL_TABLET | Freq: Every day | ORAL | 3 refills | Status: DC

## 2016-06-09 NOTE — Progress Notes (Signed)
Chief Complaint  Patient presents with  . Follow-up    CAD     History of Present Illness: 56 yo male with history of CAD and HTN who is here today for follow up. He is known to have mild CAD by cardiac cath in February 2016 (20% mid LAD stenosis). Echo January 2016 with normal LV function, no significant valve issues.    He is here today for follow up. The patient denies any chest pain, dyspnea, palpitations, lower extremity edema, orthopnea, PND, dizziness, near syncope or syncope. He has been walking for exercise.   Primary Care Physician: Haywood Pao, MD  Past Medical History:  Diagnosis Date  . CAD (coronary artery disease)   . Hypertension   . Intraocular pressure increase     Past Surgical History:  Procedure Laterality Date  . ANKLE FRACTURE SURGERY    . CORONARY ANGIOGRAM  03/03/2014   Procedure: CORONARY ANGIOGRAM;  Surgeon: Burnell Blanks, MD;  Location: [facility] CATH LAB;  Service: Cardiovascular;;  . INGUINAL HERNIA REPAIR     x2  . LUMBAR LAMINECTOMY/DECOMPRESSION MICRODISCECTOMY  09/10/2011   Procedure: LUMBAR LAMINECTOMY/DECOMPRESSION MICRODISCECTOMY 1 LEVEL;  Surgeon: Elaina Hoops, MD;  Location: [facility];  Service: Neurosurgery;  Laterality: Right;  Right Lumbar Laminectomy/Decompression Microdiscectomy Lumbar Four-Five, Five-Sacral One    Current Outpatient Prescriptions  Medication Sig Dispense Refill  . aspirin EC 81 MG tablet Take 81 mg by mouth 2 (two) times daily.     Marland Kitchen atorvastatin (LIPITOR) 10 MG tablet Take 1 tablet (10 mg total) by mouth at bedtime. 90 tablet 3  . Cholecalciferol (VITAMIN D3) 2000 UNITS TABS Take 2,000 Units by mouth daily.     . magnesium oxide (MAG-OX) 400 MG tablet Take 400 mg by mouth daily.     . Misc Natural Products (GLUCOSAMINE CHONDROITIN MSM) TABS Take 2 tablets by mouth daily.    . Multiple Vitamin (MULTIVITAMIN WITH MINERALS) TABS Take 1 tablet by mouth daily.    Marland Kitchen NITROSTAT 0.4 MG SL tablet Place 0.4 mg  under the tongue every 5 (five) minutes as needed for chest pain. Max 3  11  . olmesartan (BENICAR) 40 MG tablet Take 40 mg by mouth daily.    . Probiotic Product (PROBIOTIC PO) Take 1 tablet by mouth daily.    . travoprost, benzalkonium, (TRAVATAN) 0.004 % ophthalmic solution Place 1 drop into both eyes at bedtime.     . Turmeric 500 MG CAPS Take 500 mg by mouth 2 (two) times daily.      No current facility-administered medications for this visit.     No Known Allergies  Social History   Social History  . Marital status: Married    Spouse name: N/A  . Number of children: 3  . Years of education: N/A   Occupational History  . Environmental consultant    Social History Main Topics  . Smoking status: Never Smoker  . Smokeless tobacco: Never Used  . Alcohol use 3.0 oz/week    5 Glasses of wine per week     Comment: 5   . Drug use: No  . Sexual activity: Not on file   Other Topics Concern  . Not on file   Social History Narrative  . No narrative on file    Family History  Problem Relation Age of Onset  . Stroke Father   . Diabetes Father   . Hypertension Mother   . Hyperlipidemia Mother   . Heart  failure Mother   . Coronary artery disease Maternal Uncle   . Coronary artery disease Brother   . Colon cancer Neg Hx   . Pancreatic cancer Neg Hx   . Stomach cancer Neg Hx   . Rectal cancer Neg Hx     Review of Systems:  As stated in the HPI and otherwise negative.   BP 120/80   Pulse 64   Ht 5\' 10"  (1.778 m)   Wt 167 lb 12.8 oz (76.1 kg)   SpO2 97%   BMI 24.08 kg/m   Physical Examination:  General: Well developed, well nourished, NAD  HEENT: OP clear, mucus membranes moist  SKIN: warm, dry. No rashes. Neuro: No focal deficits  Musculoskeletal: Muscle strength 5/5 all ext  Psychiatric: Mood and affect normal  Neck: No JVD, no carotid bruits, no thyromegaly, no lymphadenopathy.  Lungs:Clear bilaterally, no wheezes, rhonci, crackles Cardiovascular: Regular rate and  rhythm. No murmurs, gallops or rubs. Abdomen:Soft. Bowel sounds present. Non-tender.  Extremities: No lower extremity edema. Pulses are 2 + in the bilateral DP/PT.  Echo 02/22/14: Left ventricle: The cavity size was normal. Systolic function was normal. The estimated ejection fraction was in the range of 55% to 60%. Wall motion was normal; there were no regional wall motion abnormalities. Left ventricular diastolic function parameters were normal.  Cardiac cath 03/03/14: Left main: No obstructive disease.  Left Anterior Descending Artery: Large caliber vessel that courses to the apex. There is a 20% stenosis in the mid vessel just beyond the takeoff of a small diagonal branch. The first diagonal branch is a small to moderate caliber vessel with no obstructive disease.  Circumflex Artery: Large dominant vessel with large bifurcating obtuse marginal branch and moderate caliber posterolateral branch. No obstructive disease.  Right Coronary Artery: Small non-dominant vessel with no obstructive disease.  Left Ventricular Angiogram: Deferred (echo last week with normal LVEF)  EKG:  EKG is ordered today. The ekg ordered today demonstrates sinus, 57 bpm. Incomplete RBBB. T wave abnormality, unchanged.   Recent Labs: No results found for requested labs within last 8760 hours.   Lipid Panel No results found for: CHOL, TRIG, HDL, CHOLHDL, VLDL, LDLCALC, LDLDIRECT   Wt Readings from Last 3 Encounters:  06/09/16 167 lb 12.8 oz (76.1 kg)  05/14/15 179 lb 6.4 oz (81.4 kg)  04/07/14 179 lb 12.8 oz (81.6 kg)     Other studies Reviewed: Additional studies/ records that were reviewed today include: . Review of the above records demonstrates:   Assessment and Plan:   1. CAD without angina: Mild CAD by cath in 2016. He has no chest pain suggestive of angina. Will continue medical management of CAD with ASA and statin. Lipids followed in primary care.    Current medicines are reviewed at  length with the patient today.  The patient does not have concerns regarding medicines.  The following changes have been made:  no change  Labs/ tests ordered today include:   Orders Placed This Encounter  Procedures  . EKG 12-Lead    Disposition:   FU with me in 12  months  Signed, Lauree Chandler, MD 06/09/2016 3:35 PM    [facility] [place], [place], [place]  [address] Phone: [phone]; Fax: [phone]

## 2016-06-09 NOTE — Patient Instructions (Signed)

## 2016-06-10 ENCOUNTER — Telehealth: Payer: Self-pay | Admitting: Cardiovascular Disease

## 2016-06-10 NOTE — Telephone Encounter (Signed)
Release faxed to [facility]

## 2016-06-25 ENCOUNTER — Telehealth: Payer: Self-pay | Admitting: Cardiovascular Disease

## 2016-06-25 NOTE — Telephone Encounter (Signed)
ROI re-faxed to [place] office.

## 2017-09-30 ENCOUNTER — Ambulatory Visit: Payer: 59 | Admitting: Physician Assistant

## 2017-11-05 ENCOUNTER — Ambulatory Visit: Payer: 59 | Admitting: Physician Assistant

## 2017-12-14 ENCOUNTER — Encounter: Payer: Self-pay | Admitting: Physician Assistant

## 2017-12-21 ENCOUNTER — Ambulatory Visit: Payer: Self-pay | Admitting: Physician Assistant

## 2018-02-05 ENCOUNTER — Encounter: Payer: Self-pay | Admitting: Physician Assistant

## 2018-02-05 NOTE — Progress Notes (Signed)
Cardiology Office Note    Date:  02/08/2018  ID:  Joshua Huynh, DOB 1960-02-27, MRN [id number] PCP:  Haywood Pao, MD  Cardiologist:  Lauree Chandler, MD   Chief Complaint: f/u CAD  History of Present Illness:  Joshua Huynh is a 58 y.o. male with history of mild CAD by cath 02/2014, HTN, increased intraocular pressure who presents for overdue routine follow-up. He is known to have mild CAD by cardiac cath in February 2016 (20% mid LAD stenosis), done for abnormal ETT with significant ischemic ST depression 1.5- 2 mm horizontal ST depression in V5-V6 and in inferior leads with exercise. Vasospasm was presented as a possbile etiology of CP. Echo January 2016 with normal LV function, no significant valve issues. Last labs in Epic 2018 glucose 112, BUN 15, Cr 1.0, K 4.8, normal LFTs, Hgb 14, plt 195, LDL 70; [facility] labs from 04/2017 showed LDL 85, trig 44, Hgb 14.3, Cr 1.10, ALT 22.  He returns for follow-up feeling great. He is exercising regularly at [employer] and has no angina or dyspnea while doing so. He does have a history of periodic nocturnal leg cramps, sometimes every few weeks, sometimes once a month. PCP has him on HCTZ but the leg cramps were there even before this was started. He has no claudication, swelling or nonhealing ulcers.   Past Medical History:  Diagnosis Date  . Abnormal stress test 2016  . CAD (coronary artery disease)    a. mild CAD by cardiac cath in February 2016 (20% mid LAD stenosis), done for abnormal ETT with significant ischemic ST depression 1.5- 2 mm horizontal ST depression in V5-V6 and in inferior leads with exercise. Vasospasm was presented as a possbile etiology of CP.  Marland Kitchen Hypertension   . Intraocular pressure increase     Past Surgical History:  Procedure Laterality Date  . ANKLE FRACTURE SURGERY    . CORONARY ANGIOGRAM  03/03/2014   Procedure: CORONARY ANGIOGRAM;  Surgeon: Burnell Blanks, MD;  Location: [facility] CATH LAB;   Service: Cardiovascular;;  . INGUINAL HERNIA REPAIR     x2  . LUMBAR LAMINECTOMY/DECOMPRESSION MICRODISCECTOMY  09/10/2011   Procedure: LUMBAR LAMINECTOMY/DECOMPRESSION MICRODISCECTOMY 1 LEVEL;  Surgeon: Elaina Hoops, MD;  Location: [facility];  Service: Neurosurgery;  Laterality: Right;  Right Lumbar Laminectomy/Decompression Microdiscectomy Lumbar Four-Five, Five-Sacral One    Current Medications: Current Meds  Medication Sig  . atorvastatin (LIPITOR) 20 MG tablet Take 20 mg by mouth daily.  . Cholecalciferol (VITAMIN D3) 2000 UNITS TABS Take 2,000 Units by mouth daily.   . hydrochlorothiazide (MICROZIDE) 12.5 MG capsule Take 12.5 mg by mouth daily.  . magnesium oxide (MAG-OX) 400 MG tablet Take 400 mg by mouth daily.   . Misc Natural Products (GLUCOSAMINE CHONDROITIN MSM) TABS Take 2 tablets by mouth daily.  . Multiple Vitamin (MULTIVITAMIN WITH MINERALS) TABS Take 1 tablet by mouth daily.  Marland Kitchen olmesartan (BENICAR) 40 MG tablet Take 40 mg by mouth daily.  . Probiotic Product (PROBIOTIC PO) Take 1 tablet by mouth daily.  . travoprost, benzalkonium, (TRAVATAN) 0.004 % ophthalmic solution Place 1 drop into both eyes at bedtime.   Marland Kitchen  aspirin EC 81 MG tablet Take 81 mg by mouth 2 (two) times daily.   . [DISCONTINUED] atorvastatin (LIPITOR) 10 MG tablet Take 1 tablet (10 mg total) by mouth at bedtime. (Patient taking differently: Take 20 mg by mouth at bedtime. )  . [DISCONTINUED] NITROSTAT 0.4 MG SL tablet Place 0.4 mg under the tongue  every 5 (five) minutes as needed for chest pain. Max 3  . [DISCONTINUED] Turmeric 500 MG CAPS Take 500 mg by mouth 2 (two) times daily.     Allergies:   Patient has no known allergies.   Social History   Socioeconomic History  . Marital status: Married    Spouse name: Not on file  . Number of children: 3  . Years of education: Not on file  . Highest education level: Not on file  Occupational History  . Occupation: Environmental consultant  Social Needs  .  Financial resource strain: Not on file  . Food insecurity:    Worry: Not on file    Inability: Not on file  . Transportation needs:    Medical: Not on file    Non-medical: Not on file  Tobacco Use  . Smoking status: Never Smoker  . Smokeless tobacco: Never Used  Substance and Sexual Activity  . Alcohol use: Yes    Alcohol/week: 5.0 standard drinks    Types: 5 Glasses of wine per week    Comment: 5   . Drug use: No  . Sexual activity: Not on file  Lifestyle  . Physical activity:    Days per week: Not on file    Minutes per session: Not on file  . Stress: Not on file  Relationships  . Social connections:    Talks on phone: Not on file    Gets together: Not on file    Attends religious service: Not on file    Active member of club or organization: Not on file    Attends meetings of clubs or organizations: Not on file    Relationship status: Not on file  Other Topics Concern  . Not on file  Social History Narrative  . Not on file     Family History:  The patient's family history includes Coronary artery disease in his brother and maternal uncle; Diabetes in his father; Heart failure in his mother; Hyperlipidemia in his mother; Hypertension in his mother; Stroke in his father. There is no history of Colon cancer, Pancreatic cancer, Stomach cancer, or Rectal cancer.  ROS:   Please see the history of present illness.  All other systems are reviewed and otherwise negative.    PHYSICAL EXAM:   VS:  BP 118/80   Pulse 66   Ht 5\' 10"  (1.778 m)   Wt 176 lb (79.8 kg)   SpO2 99%   BMI 25.25 kg/m   BMI: Body mass index is 25.25 kg/m. GEN: Well nourished, well developed WM, in no acute distress HEENT: normocephalic, atraumatic Neck: no JVD, carotid bruits, or masses Cardiac: RRR; no murmurs, rubs, or gallops, no edema  Respiratory:  clear to auscultation bilaterally, normal work of breathing GI: soft, nontender, nondistended, + BS MS: no deformity or atrophy Skin: warm and  dry, no rash Neuro:  Alert and Oriented x 3, Strength and sensation are intact, follows commands Psych: euthymic mood, full affect  Wt Readings from Last 3 Encounters:  02/08/18 176 lb (79.8 kg)  06/09/16 167 lb 12.8 oz (76.1 kg)  05/14/15 179 lb 6.4 oz (81.4 kg)      Studies/Labs Reviewed:   EKG:  EKG was ordered today and personally reviewed by me and demonstrates NSR 66bpm, occasional PACs, nonspecific TW changes  Recent Labs: No results found for requested labs within last 8760 hours.   Lipid Panel No results found for: CHOL, TRIG, HDL, CHOLHDL, VLDL, LDLCALC, LDLDIRECT  Additional studies/ records  that were reviewed today include: Summarized above.   ASSESSMENT & PLAN:   1. CAD with H/o abnormal stress test - no recurrent angina, exercising regularly. Will decrease ASA to 81mg  daily. Continue risk factor modification - PCP titrated statin and is following lipids. 2. Essential HTN - controlled on HCTZ. 3. Premature atrial contractions - noted on EKG today, asymptomatic. Check lytes. Continue to monitor. 4. Leg cramps - check BMET/Mg given HCTZ use. He does take MagOx already.  Disposition: F/u with Dr. Angelena Form in 1 year.  Of note, for some reason acute on chronic systolic CHF populated on his AVS. Not clear how this got entered in his chart but we deleted this and crossed out on patient's paperwork.  Medication Adjustments/Labs and Tests Ordered: Current medicines are reviewed at length with the patient today.  Concerns regarding medicines are outlined above. Medication changes, Labs and Tests ordered today are summarized above and listed in the Patient Instructions accessible in Encounters.   Signed, Charlie Pitter, PA-C  02/08/2018 8:58 AM    [facility] [place], [place], [place]  [address] Phone: [phone]; Fax: [phone]

## 2018-02-08 ENCOUNTER — Encounter: Payer: Self-pay | Admitting: Physician Assistant

## 2018-02-08 ENCOUNTER — Ambulatory Visit: Payer: PRIVATE HEALTH INSURANCE | Admitting: Physician Assistant

## 2018-02-08 VITALS — BP 118/80 | HR 66 | Ht 70.0 in | Wt 176.0 lb

## 2018-02-08 DIAGNOSIS — R252 Cramp and spasm: Secondary | ICD-10-CM

## 2018-02-08 DIAGNOSIS — R9439 Abnormal result of other cardiovascular function study: Secondary | ICD-10-CM

## 2018-02-08 DIAGNOSIS — I491 Atrial premature depolarization: Secondary | ICD-10-CM

## 2018-02-08 DIAGNOSIS — I2510 Atherosclerotic heart disease of native coronary artery without angina pectoris: Secondary | ICD-10-CM | POA: Insufficient documentation

## 2018-02-08 DIAGNOSIS — R739 Hyperglycemia, unspecified: Secondary | ICD-10-CM | POA: Insufficient documentation

## 2018-02-08 DIAGNOSIS — I10 Essential (primary) hypertension: Secondary | ICD-10-CM

## 2018-02-08 LAB — BASIC METABOLIC PANEL
BUN/Creatinine Ratio: 25 — ABNORMAL HIGH (ref 9–20)
BUN: 26 mg/dL — AB (ref 6–24)
CHLORIDE: 104 mmol/L (ref 96–106)
CO2: 24 mmol/L (ref 20–29)
Calcium: 9.7 mg/dL (ref 8.7–10.2)
Creatinine, Ser: 1.05 mg/dL (ref 0.76–1.27)
GFR calc Af Amer: 92 mL/min/{1.73_m2} (ref >59)
GFR, EST NON AFRICAN AMERICAN: 80 mL/min/{1.73_m2} (ref >59)
Glucose: 102 mg/dL — ABNORMAL HIGH (ref 65–99)
Potassium: 4.5 mmol/L (ref 3.5–5.2)
Sodium: 144 mmol/L (ref 134–144)

## 2018-02-08 LAB — MAGNESIUM: Magnesium: 2 mg/dL (ref 1.6–2.3)

## 2018-02-08 MED ORDER — ASPIRIN EC 81 MG PO TBEC: 81.0000 mg | DELAYED_RELEASE_TABLET | Freq: Every day | ORAL | 3 refills | Status: AC

## 2018-02-08 NOTE — Patient Instructions (Signed)
Medication Instructions:  Your physician has recommended you make the following change in your medication:  1.  DECREASE the Aspirin to 1 tablet daily   If you need a refill on your cardiac medications before your next appointment, please call your pharmacy.   Lab work: TODAY:  BMET & MAG  If you have labs (blood work) drawn today and your tests are completely normal, you will receive your results only by: Marland Kitchen MyChart Message (if you have MyChart) OR . A paper copy in the mail If you have any lab test that is abnormal or we need to change your treatment, we will call you to review the results.  Testing/Procedures: None ordered  Follow-Up: At [facility], you and your health needs are our priority.  As part of our continuing mission to provide you with exceptional heart care, we have created designated Provider Care Teams.  These Care Teams include your primary Cardiologist (physician) and Advanced Practice Providers (APPs -  Physician Assistants and Nurse Practitioners) who all work together to provide you with the care you need, when you need it. You will need a follow up appointment in 12 months.  Please call our office 2 months in advance to schedule this appointment.  You may see Lauree Chandler, MD or one of the following Advanced Practice Providers on your designated Care Team:   [place], PA-C Melina Copa, PA-C . Ermalinda Barrios, PA-C  Any Other Special Instructions Will Be Listed Below (If Applicable).

## 2018-02-09 ENCOUNTER — Telehealth: Payer: Self-pay | Admitting: *Deleted

## 2018-02-09 DIAGNOSIS — Z79899 Other long term (current) drug therapy: Secondary | ICD-10-CM

## 2018-02-09 NOTE — Telephone Encounter (Signed)
-----   Message from Charlie Pitter, [place] sent at 02/08/2018  4:55 PM EST ----- Please let patient know labs show he's a little dehydrated. This is similar to 3 years ago.  Electrolytes are fine. He is on a diuretic which can exacerbate this. He also reported drinking a lot of coffee (36 oz). Would try and replace that coffee with water instead. Would suggest repeat BMET in 1 week - can either have through our office or PCP. If it remains up, would need to consider switching HCTZ to another medicine. Dayna Dunn PA-C

## 2018-02-09 NOTE — Telephone Encounter (Signed)
Called pt re: lab results, left a message for him to call back  

## 2018-02-12 NOTE — Telephone Encounter (Signed)
2nd attempt to reach pt re: lab results, left another message for pt to call back. 

## 2018-02-19 ENCOUNTER — Other Ambulatory Visit: Payer: PRIVATE HEALTH INSURANCE | Admitting: *Deleted

## 2018-02-19 DIAGNOSIS — Z79899 Other long term (current) drug therapy: Secondary | ICD-10-CM

## 2018-02-19 LAB — BASIC METABOLIC PANEL
BUN/Creatinine Ratio: 18 (ref 9–20)
BUN: 19 mg/dL (ref 6–24)
CHLORIDE: 104 mmol/L (ref 96–106)
CO2: 23 mmol/L (ref 20–29)
Calcium: 9.4 mg/dL (ref 8.7–10.2)
Creatinine, Ser: 1.05 mg/dL (ref 0.76–1.27)
GFR calc Af Amer: 92 mL/min/{1.73_m2} (ref >59)
GFR calc non Af Amer: 80 mL/min/{1.73_m2} (ref >59)
GLUCOSE: 59 mg/dL — AB (ref 65–99)
Potassium: 4.1 mmol/L (ref 3.5–5.2)
SODIUM: 143 mmol/L (ref 134–144)

## 2018-06-30 ENCOUNTER — Telehealth: Payer: Self-pay

## 2018-06-30 NOTE — Telephone Encounter (Signed)
Called patient to schedule recall colonoscopy.  Patient states he is in between jobs right now and doesn't have any insurance.  He would like to put off for a while.  Thanks, Peter Congo

## 2018-07-12 ENCOUNTER — Encounter: Payer: Self-pay | Admitting: Internal Medicine

## 2019-02-04 ENCOUNTER — Ambulatory Visit: Payer: PRIVATE HEALTH INSURANCE | Admitting: Cardiovascular Disease

## 2019-02-25 ENCOUNTER — Encounter: Payer: Self-pay | Admitting: Internal Medicine

## 2019-03-21 ENCOUNTER — Other Ambulatory Visit: Payer: Self-pay

## 2019-03-21 ENCOUNTER — Ambulatory Visit (AMBULATORY_SURGERY_CENTER): Payer: Self-pay | Admitting: *Deleted

## 2019-03-21 VITALS — Temp 97.7°F | Ht 70.0 in | Wt 174.0 lb

## 2019-03-21 DIAGNOSIS — Z01818 Encounter for other preprocedural examination: Secondary | ICD-10-CM

## 2019-03-21 DIAGNOSIS — Z86010 Personal history of colonic polyps: Secondary | ICD-10-CM

## 2019-03-21 MED ORDER — NA SULFATE-K SULFATE-MG SULF 17.5-3.13-1.6 GM/177ML PO SOLN: 1.0000 | Freq: Once | ORAL | 0 refills | Status: AC

## 2019-03-21 NOTE — Progress Notes (Signed)

## 2019-03-30 ENCOUNTER — Encounter: Payer: Self-pay | Admitting: Internal Medicine

## 2019-03-30 ENCOUNTER — Other Ambulatory Visit: Payer: Self-pay | Admitting: Internal Medicine

## 2019-03-30 ENCOUNTER — Ambulatory Visit (INDEPENDENT_AMBULATORY_CARE_PROVIDER_SITE_OTHER): Payer: 59

## 2019-03-30 DIAGNOSIS — Z1159 Encounter for screening for other viral diseases: Secondary | ICD-10-CM

## 2019-03-30 LAB — SARS CORONAVIRUS 2 (TAT 6-24 HRS): SARS Coronavirus 2: NEGATIVE

## 2019-04-04 ENCOUNTER — Encounter: Payer: Self-pay | Admitting: Internal Medicine

## 2019-04-04 ENCOUNTER — Ambulatory Visit (AMBULATORY_SURGERY_CENTER): Payer: 59 | Admitting: Internal Medicine

## 2019-04-04 ENCOUNTER — Other Ambulatory Visit: Payer: Self-pay

## 2019-04-04 VITALS — BP 126/80 | HR 69 | Temp 95.5°F | Resp 12 | Ht 70.0 in | Wt 174.0 lb

## 2019-04-04 DIAGNOSIS — Z86010 Personal history of colonic polyps: Secondary | ICD-10-CM

## 2019-04-04 MED ORDER — SODIUM CHLORIDE 0.9 % IV SOLN: 500.0000 mL | Freq: Once | INTRAVENOUS | Status: DC

## 2019-04-04 NOTE — Op Note (Signed)
[facility] Patient Name: Joshua Huynh Procedure Date: 04/04/2019 9:27 AM MRN: [id number] Endoscopist: Docia Chuck. Henrene Pastor , MD Age: 59 Referring MD:  Date of Birth: May 24, 1960 Gender: Male Account #: [id number] Procedure:                Colonoscopy Indications:              High risk colon cancer surveillance: Personal                            history of sessile serrated colon polyp (less than                            10 mm in size) with no dysplasia. Index examination                            May 2015 Medicines:                Monitored Anesthesia Care Procedure:                Pre-Anesthesia Assessment:                           - Prior to the procedure, a History and Physical                            was performed, and patient medications and                            allergies were reviewed. The patient's tolerance of                            previous anesthesia was also reviewed. The risks                            and benefits of the procedure and the sedation                            options and risks were discussed with the patient.                            All questions were answered, and informed consent                            was obtained. Prior Anticoagulants: The patient has                            taken no previous anticoagulant or antiplatelet                            agents. ASA Grade Assessment: II - A patient with                            mild systemic disease. After reviewing the risks  and benefits, the patient was deemed in                            satisfactory condition to undergo the procedure.                           After obtaining informed consent, the colonoscope                            was passed under direct vision. Throughout the                            procedure, the patient's blood pressure, pulse, and                            oxygen saturations were monitored continuously. The                             Colonoscope was introduced through the anus and                            advanced to the the cecum, identified by                            appendiceal orifice and ileocecal valve. The                            ileocecal valve, appendiceal orifice, and rectum                            were photographed. The quality of the bowel                            preparation was good. The colonoscopy was performed                            without difficulty. The patient tolerated the                            procedure well. The bowel preparation used was                            SUPREP via split dose instruction. Scope In: 9:37:31 AM Scope Out: 9:53:59 AM Scope Withdrawal Time: 0 hours 12 minutes 25 seconds  Total Procedure Duration: 0 hours 16 minutes 28 seconds  Findings:                 Multiple diverticula were found in the left colon.                           Internal hemorrhoids were found during retroflexion.                           The exam was otherwise without abnormality on  direct and retroflexion views. Complications:            No immediate complications. Estimated blood loss:                            None. Estimated Blood Loss:     Estimated blood loss: none. Impression:               - Diverticulosis in the left colon.                           - Internal hemorrhoids.                           - The examination was otherwise normal on direct                            and retroflexion views.                           - No specimens collected. Recommendation:           - Repeat colonoscopy in 10 years for surveillance.                           - Patient has a contact number available for                            emergencies. The signs and symptoms of potential                            delayed complications were discussed with the                            patient. Return to normal activities tomorrow.                             Written discharge instructions were provided to the                            patient.                           - Resume previous diet.                           - Continue present medications. Docia Chuck. Henrene Pastor, MD 04/04/2019 10:03:05 AM This report has been signed electronically.

## 2019-04-04 NOTE — Progress Notes (Signed)
Temp by JB Vitals by CW  Pt's states no medical or surgical changes since previsit or office visit.  

## 2019-04-04 NOTE — Patient Instructions (Signed)
Handouts given for diverticulosis and hemorrhoids.  Repeat colonoscopy in 10 years!  YOU HAD AN ENDOSCOPIC PROCEDURE TODAY AT [facility]:   Refer to the procedure report that was given to you for any specific questions about what was found during the examination.  If the procedure report does not answer your questions, please call your gastroenterologist to clarify.  If you requested that your care partner not be given the details of your procedure findings, then the procedure report has been included in a sealed envelope for you to review at your convenience later.  YOU SHOULD EXPECT: Some feelings of bloating in the abdomen. Passage of more gas than usual.  Walking can help get rid of the air that was put into your GI tract during the procedure and reduce the bloating. If you had a lower endoscopy (such as a colonoscopy or flexible sigmoidoscopy) you may notice spotting of blood in your stool or on the toilet paper. If you underwent a bowel prep for your procedure, you may not have a normal bowel movement for a few days.  Please Note:  You might notice some irritation and congestion in your nose or some drainage.  This is from the oxygen used during your procedure.  There is no need for concern and it should clear up in a day or so.  SYMPTOMS TO REPORT IMMEDIATELY:   Following lower endoscopy (colonoscopy or flexible sigmoidoscopy):  Excessive amounts of blood in the stool  Significant tenderness or worsening of abdominal pains  Swelling of the abdomen that is new, acute  Fever of 100F or higher  For urgent or emergent issues, a gastroenterologist can be reached at any hour by calling [phone]. Do not use MyChart messaging for urgent concerns.    DIET:  We do recommend a small meal at first, but then you may proceed to your regular diet.  Drink plenty of fluids but you should avoid alcoholic beverages for 24 hours.  ACTIVITY:  You should plan to take it easy for  the rest of today and you should NOT DRIVE or use heavy machinery until tomorrow (because of the sedation medicines used during the test).    FOLLOW UP: Our staff will call the number listed on your records 48-72 hours following your procedure to check on you and address any questions or concerns that you may have regarding the information given to you following your procedure. If we do not reach you, we will leave a message.  We will attempt to reach you two times.  During this call, we will ask if you have developed any symptoms of COVID 19. If you develop any symptoms (ie: fever, flu-like symptoms, shortness of breath, cough etc.) before then, please call [phone].  If you test positive for Covid 19 in the 2 weeks post procedure, please call and report this information to [place].    If any biopsies were taken you will be contacted by phone or by letter within the next 1-3 weeks.  Please call us at [phone] if you have not heard about the biopsies in 3 weeks.    SIGNATURES/CONFIDENTIALITY: You and/or your care partner have signed paperwork which will be entered into your electronic medical record.  These signatures attest to the fact that that the information above on your After Visit Summary has been reviewed and is understood.  Full responsibility of the confidentiality of this discharge information lies with you and/or your care-partner.

## 2019-04-04 NOTE — Progress Notes (Signed)
PT taken to PACU. Monitors in place. VSS. Report given to RN. 

## 2019-04-06 ENCOUNTER — Telehealth: Payer: Self-pay

## 2019-04-06 NOTE — Telephone Encounter (Signed)
  Follow up Call-  Call back number 04/04/2019  Post procedure Call Back phone  # [phone]  Permission to leave phone message Yes  Some recent data might be hidden     Patient questions:  Do you have a fever, pain , or abdominal swelling? No. Pain Score  0 *  Have you tolerated food without any problems? Yes.    Have you been able to return to your normal activities? Yes.    Do you have any questions about your discharge instructions: Diet   No. Medications  No. Follow up visit  No.  Do you have questions or concerns about your Care? No.  Actions: * If pain score is 4 or above: No action needed, pain <4.  1. Have you developed a fever since your procedure? no  2.   Have you had an respiratory symptoms (SOB or cough) since your procedure? no  3.   Have you tested positive for COVID 19 since your procedure no  4.   Have you had any family members/close contacts diagnosed with the COVID 19 since your procedure?  no   If yes to any of these questions please route to Joylene John, RN and Alphonsa Gin, Therapist, sports.

## 2019-04-08 ENCOUNTER — Ambulatory Visit: Payer: PRIVATE HEALTH INSURANCE | Admitting: Physician Assistant

## 2019-04-21 NOTE — Progress Notes (Signed)
Cardiology Office Note   Date:  04/22/2019   ID:  Joshua Huynh, DOB 06-Apr-1960, MRN [id number]  PCP:  Haywood Pao, MD  Cardiologist:  Dr. Angelena Form, MD  Chief Complaint  Patient presents with  . Follow-up   History of Present Illness: Rocky Vitali is a 59 y.o. male who presents for 1 year follow-up, seen for Dr. Angelena Form.  Mr. Kusner has a history of mild CAD by cath 02/2014, HTN, increased intraocular pressure who presents for overdue routine follow-up. He is known to have mild CAD by cardiac cath in February 2016 (20% mid LAD stenosis), done for abnormal ETT with significant ischemic ST depression 1.5- 2 mm horizontal ST depression in V5-V6 and in inferior leads with exercise. Vasospasm was presented as a possbile etiology of CP. Echo January 2016 with normal LV function, no significant valve issues.   He was last seen by Melina Copa, PA on 02/08/2018 at which time he was doing very well from a CV standpoint.  He was exercising regularly at [employer] with no angina or dyspnea.  Today he presents and states that he has been doing well from a CV standpoint. He denies chest pain, SOB, palpitations, orthopnea, LE edema, dizziness, or syncope. He reports that he recently started a new job and therefore he is under more stress than usual but is adapting well. Given this he has been unable to exercise quite as much. He reports that his leg cramps have improved with the addition on magnesium supplementation.   Past Medical History:  Diagnosis Date  . Abnormal stress test 2016  . CAD (coronary artery disease)    a. mild CAD by cardiac cath in February 2016 (20% mid LAD stenosis), done for abnormal ETT with significant ischemic ST depression 1.5- 2 mm horizontal ST depression in V5-V6 and in inferior leads with exercise. Vasospasm was presented as a possbile etiology of CP.  Marland Kitchen Hypertension   . Intraocular pressure increase   . Premature atrial contractions    a.  noted on EKG 01/2018, asymptomatic.    Past Surgical History:  Procedure Laterality Date  . ANKLE FRACTURE SURGERY    . CORONARY ANGIOGRAM  03/03/2014   Procedure: CORONARY ANGIOGRAM;  Surgeon: Burnell Blanks, MD;  Location: [facility] CATH LAB;  Service: Cardiovascular;;  . INGUINAL HERNIA REPAIR     x2  . LUMBAR LAMINECTOMY/DECOMPRESSION MICRODISCECTOMY  09/10/2011   Procedure: LUMBAR LAMINECTOMY/DECOMPRESSION MICRODISCECTOMY 1 LEVEL;  Surgeon: Elaina Hoops, MD;  Location: [facility];  Service: Neurosurgery;  Laterality: Right;  Right Lumbar Laminectomy/Decompression Microdiscectomy Lumbar Four-Five, Five-Sacral One    Current Outpatient Medications  Medication Sig Dispense Refill  . aspirin EC 81 MG tablet Take 1 tablet (81 mg total) by mouth daily. 90 tablet 3  . atorvastatin (LIPITOR) 40 MG tablet Take 40 mg by mouth daily.    . Cholecalciferol (VITAMIN D3) 2000 UNITS TABS Take 2,000 Units by mouth daily.     . hydrochlorothiazide (MICROZIDE) 12.5 MG capsule Take 12.5 mg by mouth daily.    . magnesium oxide (MAG-OX) 400 MG tablet Take 400 mg by mouth daily.     . Misc Natural Products (GLUCOSAMINE CHONDROITIN MSM) TABS Take 2 tablets by mouth daily.    . Multiple Vitamin (MULTIVITAMIN WITH MINERALS) TABS Take 1 tablet by mouth daily.    Marland Kitchen olmesartan (BENICAR) 40 MG tablet Take 40 mg by mouth daily.    . Probiotic Product (PROBIOTIC PO) Take 1 tablet by mouth  daily.    . XELPROS 0.005 % EMUL Apply 1 drop to eye at bedtime.     No current facility-administered medications for this visit.    Allergies:   Patient has no known allergies.    Social History:  The patient  reports that he has never smoked. He has never used smokeless tobacco. He reports current alcohol use of about 5.0 standard drinks of alcohol per week. He reports that he does not use drugs.   Family History:  The patient's family history includes Coronary artery disease in his brother and maternal uncle; Diabetes in  his father; Heart failure in his mother; Hyperlipidemia in his mother; Hypertension in his mother; Stroke in his father.    ROS:  Please see the history of present illness. Otherwise, review of systems are positive for none.  All other systems are reviewed and negative.    PHYSICAL EXAM: VS:  BP 126/76   Pulse (!) 56   Ht 5' 10.5" (1.791 m)   Wt 173 lb 9.6 oz (78.7 kg)   SpO2 96%   BMI 24.56 kg/m  , BMI Body mass index is 24.56 kg/m.   General: Well developed, well nourished, NAD Neck: Negative for carotid bruits. No JVD Lungs:Clear to ausculation bilaterally. No wheezes, rales, or rhonchi. Breathing is unlabored. Cardiovascular: RRR with S1 S2. No murmurs.  Extremities: No edema. Radial pulses 2+ bilaterally Neuro: Alert and oriented. No focal deficits. No facial asymmetry. MAE spontaneously. Psych: Responds to questions appropriately with normal affect.    EKG:  EKG is ordered today. The ekg ordered today demonstrates SB with HR 51bpm and no change from prior tracing.    Recent Labs: No results found for requested labs within last 8760 hours.    Lipid Panel No results found for: CHOL, TRIG, HDL, CHOLHDL, VLDL, LDLCALC, LDLDIRECT    Wt Readings from Last 3 Encounters:  04/22/19 173 lb 9.6 oz (78.7 kg)  04/04/19 174 lb (78.9 kg)  03/21/19 174 lb (78.9 kg)    Other studies Reviewed: Additional studies/ records that were reviewed today include:   Echocardiogram 02/22/2014:   - Left ventricle: The cavity size was normal. Systolic function was  normal. The estimated ejection fraction was in the range of 55%  to 60%. Wall motion was normal; there were no regional wall  motion abnormalities. Left ventricular diastolic function  parameters were normal.   ASSESSMENT AND PLAN:  1.  CAD with history of abnormal stress test: -No recurrent angina -Continues to exercise regularly without CV symptoms -Continue ASA 81 and statin for risk factor modification  2.   Essential hypertension: -Stable, 126/76 -Continue HCTZ, Olmesartan  -Lab work today  3.  PACs: -Noted on last EKG during routine follow-up, asymptomatic -Lab work today   Current medicines are reviewed at length with the patient today.  The patient does not have concerns regarding medicines.  The following changes have been made:  no change  Labs/ tests ordered today include: BMET   Orders Placed This Encounter  Procedures  . Basic metabolic panel  . EKG 12-Lead    Disposition:   FU with Dr. Angelena Form in 1 year  Signed, Kathyrn Drown, NP  04/22/2019 9:23 AM    [address], [place], [place]  [address] Phone: [phone]; Fax: [phone]

## 2019-04-22 ENCOUNTER — Other Ambulatory Visit: Payer: Self-pay

## 2019-04-22 ENCOUNTER — Encounter: Payer: Self-pay | Admitting: Cardiology

## 2019-04-22 ENCOUNTER — Ambulatory Visit (INDEPENDENT_AMBULATORY_CARE_PROVIDER_SITE_OTHER): Payer: 59 | Admitting: Cardiology

## 2019-04-22 VITALS — BP 126/76 | HR 56 | Ht 70.5 in | Wt 173.6 lb

## 2019-04-22 DIAGNOSIS — I10 Essential (primary) hypertension: Secondary | ICD-10-CM | POA: Diagnosis not present

## 2019-04-22 DIAGNOSIS — Z79899 Other long term (current) drug therapy: Secondary | ICD-10-CM

## 2019-04-22 DIAGNOSIS — I2510 Atherosclerotic heart disease of native coronary artery without angina pectoris: Secondary | ICD-10-CM | POA: Diagnosis not present

## 2019-04-22 LAB — BASIC METABOLIC PANEL
BUN/Creatinine Ratio: 17 (ref 9–20)
BUN: 19 mg/dL (ref 6–24)
CO2: 24 mmol/L (ref 20–29)
Calcium: 9.7 mg/dL (ref 8.7–10.2)
Chloride: 102 mmol/L (ref 96–106)
Creatinine, Ser: 1.13 mg/dL (ref 0.76–1.27)
GFR calc Af Amer: 84 mL/min/{1.73_m2} (ref >59)
GFR calc non Af Amer: 72 mL/min/{1.73_m2} (ref >59)
Glucose: 96 mg/dL (ref 65–99)
Potassium: 4.2 mmol/L (ref 3.5–5.2)
Sodium: 139 mmol/L (ref 134–144)

## 2019-04-22 NOTE — Patient Instructions (Signed)
Medication Instructions:   Your physician recommends that you continue on your current medications as directed. Please refer to the Current Medication list given to you today.  *If you need a refill on your cardiac medications before your next appointment, please call your pharmacy*  Lab Work:  You will have labs drawn today: BMET  If you have labs (blood work) drawn today and your tests are completely normal, you will receive your results only by: Marland Kitchen MyChart Message (if you have MyChart) OR . A paper copy in the mail If you have any lab test that is abnormal or we need to change your treatment, we will call you to review the results.  Testing/Procedures:  None ordered today  Follow-Up: At [facility], you and your health needs are our priority.  As part of our continuing mission to provide you with exceptional heart care, we have created designated Provider Care Teams.  These Care Teams include your primary Cardiologist (physician) and Advanced Practice Providers (APPs -  Physician Assistants and Nurse Practitioners) who all work together to provide you with the care you need, when you need it.  We recommend signing up for the patient portal called "MyChart".  Sign up information is provided on this After Visit Summary.  MyChart is used to connect with patients for Virtual Visits (Telemedicine).  Patients are able to view lab/test results, encounter notes, upcoming appointments, etc.  Non-urgent messages can be sent to your provider as well.   To learn more about what you can do with MyChart, go to [email or web address].    Your next appointment:   12 month(s)  The format for your next appointment:   In Person  Provider:   Lauree Chandler, MD

## 2020-05-02 NOTE — Progress Notes (Signed)
Cardiology Office Note    Date:  05/04/2020   ID:  Joshua Huynh, DOB 16-Feb-1960, MRN [id number]  PCP:  Haywood Pao, MD  Cardiologist:  Lauree Chandler, MD  Electrophysiologist:  None   Chief Complaint: f/u mild CAD  History of Present Illness:   Joshua Huynh is a 60 y.o. male with history of mild CAD by cath 02/2014, HTN, incidental PACs noted on prior EKG, increased intraocular pressure. He is known to have mild CAD by cardiac cath in February 2016 (20% mid LAD stenosis), done for abnormal ETT with significant ischemic ST depression 1.5- 2 mm horizontal ST depression in V5-V6 and in inferior leads with exercise. Vasospasm was presented as a possbile etiology of CP. Echo January 2016 with normal LV function, no significant valve issues.   He is seen back for follow-up today overall doing well without any chest pain, SOB, palpitations, orthopnea, edema or syncope. Has very mild occasional dizziness when standing up too quick but overall largely a non-issue. BP noted to be elevated today. Only checks this about 1x/month at home so we don't have a great idea of what it's running regularly. He exercises about 3x/week doing a variety of activities and also walks the dog regularly. He drinks 24-36oz of coffee daily. Takes HCTZ in AM, Benicar at night.  Labwork independently reviewed: Scanned labs 06/2019 Cr 1.2, K 4.5, LFTs ok, Hgb 14.1, Plt 222, LDL 62   Past Medical History:  Diagnosis Date  . Abnormal stress test 2016  . CAD (coronary artery disease)    a. mild CAD by cardiac cath in February 2016 (20% mid LAD stenosis), done for abnormal ETT with significant ischemic ST depression 1.5- 2 mm horizontal ST depression in V5-V6 and in inferior leads with exercise. Vasospasm was presented as a possbile etiology of CP.  Marland Kitchen Hypertension   . Intraocular pressure increase   . Premature atrial contractions    a. noted on EKG 01/2018, asymptomatic.    Past Surgical History:   Procedure Laterality Date  . ANKLE FRACTURE SURGERY    . CORONARY ANGIOGRAM  03/03/2014   Procedure: CORONARY ANGIOGRAM;  Surgeon: Burnell Blanks, MD;  Location: [facility] CATH LAB;  Service: Cardiovascular;;  . INGUINAL HERNIA REPAIR     x2  . LUMBAR LAMINECTOMY/DECOMPRESSION MICRODISCECTOMY  09/10/2011   Procedure: LUMBAR LAMINECTOMY/DECOMPRESSION MICRODISCECTOMY 1 LEVEL;  Surgeon: Elaina Hoops, MD;  Location: [place] NEURO ORS;  Service: Neurosurgery;  Laterality: Right;  Right Lumbar Laminectomy/Decompression Microdiscectomy Lumbar Four-Five, Five-Sacral One    Current Medications: Current Meds  Medication Sig  . aspirin EC 81 MG tablet Take 1 tablet (81 mg total) by mouth daily.  Marland Kitchen atorvastatin (LIPITOR) 40 MG tablet Take 40 mg by mouth daily.  . Cholecalciferol (VITAMIN D3) 2000 UNITS TABS Take 2,000 Units by mouth daily.   . hydrochlorothiazide (MICROZIDE) 12.5 MG capsule Take 12.5 mg by mouth daily.  Marland Kitchen latanoprost (XALATAN) 0.005 % ophthalmic solution 1 drop at bedtime.  . magnesium oxide (MAG-OX) 400 MG tablet Take 400 mg by mouth daily.   . Misc Natural Products (GLUCOSAMINE CHONDROITIN MSM) TABS Take 2 tablets by mouth daily.  . Multiple Vitamin (MULTIVITAMIN WITH MINERALS) TABS Take 1 tablet by mouth daily.  Marland Kitchen olmesartan (BENICAR) 40 MG tablet Take 40 mg by mouth daily.  . Probiotic Product (PROBIOTIC PO) Take 1 tablet by mouth daily.     Allergies:   Patient has no known allergies.   Social History   Socioeconomic History  .  Marital status: Married    Spouse name: Not on file  . Number of children: 3  . Years of education: Not on file  . Highest education level: Not on file  Occupational History  . Occupation: Environmental consultant  Tobacco Use  . Smoking status: Never Smoker  . Smokeless tobacco: Never Used  Substance and Sexual Activity  . Alcohol use: Yes    Alcohol/week: 5.0 standard drinks    Types: 5 Glasses of wine per week    Comment: 5   . Drug use: No  . Sexual  activity: Not on file  Other Topics Concern  . Not on file  Social History Narrative  . Not on file   Social Determinants of Health   Financial Resource Strain: Not on file  Food Insecurity: Not on file  Transportation Needs: Not on file  Physical Activity: Not on file  Stress: Not on file  Social Connections: Not on file     Family History:  The patient's family history includes Coronary artery disease in his brother and maternal uncle; Diabetes in his father; Heart failure in his mother; Hyperlipidemia in his mother; Hypertension in his mother; Stroke in his father. There is no history of Colon cancer, Pancreatic cancer, Stomach cancer, Rectal cancer, or Esophageal cancer.  ROS:   Please see the history of present illness.  All other systems are reviewed and otherwise negative.    EKGs/Labs/Other Studies Reviewed:    Studies reviewed are outlined and summarized above. Reports included below if pertinent.  2d echo 2016 Left ventricle: The cavity size was normal. Systolic function was  normal. The estimated ejection fraction was in the range of 55%  to 60%. Wall motion was normal; there were no regional wall  motion abnormalities. Left ventricular diastolic function  parameters were normal.   Cardiac Catheterization Operative Report  Joshua Huynh [id number] 2/5/20168:10 AM Haywood Pao, MD  Procedure Performed:  1. Selective Coronary Angiography  Operator: Lauree Chandler, MD  Arterial access site:  Right radial artery.   Indication:  60 yo male with history of HTN with recent chest pain. Exercise stress test with ST depression concerning for ischemia. Echo with normal LV function. Cardiac cath to exclude obstructive CAD.                                      Procedure Details: The risks, benefits, complications, treatment options, and expected outcomes were discussed with the patient. The patient and/or family concurred with the  proposed plan, giving informed consent. The patient was brought to the cath lab after IV hydration was begun and oral premedication was given. The patient was further sedated with Versed and Fentanyl. The right wrist was assessed with a modified Allens test which was positive. The right wrist was prepped and draped in a sterile fashion. 1% lidocaine was used for local anesthesia. Using the modified Seldinger access technique, a 5 French sheath was placed in the right radial artery. 3 mg Verapamil was given through the sheath. 3500 units IV heparin was given. Standard diagnostic catheters were used to perform selective coronary angiography. He had intense spasm in his right radial artery following the selective angiography. He was given an additional 3 mg Verapamil through the sheath. I was unable to pass the pigtail catheter to the LV. No left heart cath performed. I did not feel that the data to be obtained from crossing the  aortic valve was necessary given the risk of catheter complications with ongoing radial artery spasm. (LV function normal by echo last week). The sheath was removed from the right radial artery and a Terumo hemostasis band was applied at the arteriotomy site on the right wrist.   There were no immediate complications. The patient was taken to the recovery area in stable condition.   Hemodynamic Findings: Central aortic pressure: 116/66  Angiographic Findings:  Left main: No obstructive disease.   Left Anterior Descending Artery: Large caliber vessel that courses to the apex. There is a 20% stenosis in the mid vessel just beyond the takeoff of a small diagonal branch. The first diagonal branch is a small to moderate caliber vessel with no obstructive disease.   Circumflex Artery: Large dominant vessel with large bifurcating obtuse marginal branch and moderate caliber posterolateral branch. No obstructive disease.   Right Coronary Artery: Small non-dominant vessel with no  obstructive disease.   Left Ventricular Angiogram: Deferred (echo last week with normal LVEF)  Impression: 1. Mild non-obstructive CAD 2. Normal LV function by echo last week 3. 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 If he has recurrent episodes of chest pain, can consider Imdur or Norvasc for spasm.        Complications:  None. The patient tolerated the procedure well.                    EKG:  EKG is ordered today, personally reviewed, demonstrating NSR 62bpm, nonspecific TW changes similar to prior  Recent Labs: No results found for requested labs within last 8760 hours.  Recent Lipid Panel No results found for: CHOL, TRIG, HDL, CHOLHDL, VLDL, LDLCALC, LDLDIRECT  PHYSICAL EXAM:    VS:  BP (!) 130/92   Pulse 62   Ht 5\' 10"  (1.778 m)   Wt 176 lb 6.4 oz (80 kg)   SpO2 98%   BMI 25.31 kg/m   BMI: Body mass index is 25.31 kg/m.  GEN: Well nourished, well developed male in no acute distress HEENT: normocephalic, atraumatic Neck: no JVD, carotid bruits, or masses Cardiac: RRR; no murmurs, rubs, or gallops, no edema  Respiratory:  clear to auscultation bilaterally, normal work of breathing GI: soft, nontender, nondistended, + BS MS: no deformity or atrophy Skin: warm and dry, no rash Neuro:  Alert and Oriented x 3, Strength and sensation are intact, follows commands Psych: euthymic mood, full affect  Wt Readings from Last 3 Encounters:  05/04/20 176 lb 6.4 oz (80 kg)  04/22/19 173 lb 9.6 oz (78.7 kg)  04/04/19 174 lb (78.9 kg)     ASSESSMENT & PLAN:   1. CAD - doing well without angina. Continue ASA, statin. Lipids are followed by primary care. He has a physical scheduled in June per his report.  2. Essential HTN - Suboptimal blood pressure control noted today. We discussed cutting down caffeine and taking his Benicar in the AM instead.  We need a better idea of what it's running at home. The patient was provided instructions on monitoring blood pressure at home for 1 week and relaying results to our office. If need be, consider titrating HCTZ to 25mg  daily with a BMET in 1 week thereafter. Ideally he would cut down on the coffee because that functions as a diuretic as well.  3. PACs - quiescent, no recent symptoms. No  ectopy on EKG or auscultation.  Disposition: F/u with Dr. Angelena Form in 1 year.  Medication Adjustments/Labs and Tests Ordered: Current medicines are reviewed at length with the patient today.  Concerns regarding medicines are outlined above. Medication changes, Labs and Tests ordered today are summarized above and listed in the Patient Instructions accessible in Encounters.   Signed, Charlie Pitter, PA-C  05/04/2020 4:14 PM    [facility] [address], [place], [place]  [address] Phone: [phone]; Fax: [phone]

## 2020-05-04 ENCOUNTER — Other Ambulatory Visit: Payer: Self-pay

## 2020-05-04 ENCOUNTER — Ambulatory Visit (INDEPENDENT_AMBULATORY_CARE_PROVIDER_SITE_OTHER): Payer: Managed Care, Other (non HMO) | Admitting: Physician Assistant

## 2020-05-04 ENCOUNTER — Encounter: Payer: Self-pay | Admitting: Physician Assistant

## 2020-05-04 VITALS — BP 130/92 | HR 62 | Ht 70.0 in | Wt 176.4 lb

## 2020-05-04 DIAGNOSIS — I491 Atrial premature depolarization: Secondary | ICD-10-CM | POA: Diagnosis not present

## 2020-05-04 DIAGNOSIS — I2510 Atherosclerotic heart disease of native coronary artery without angina pectoris: Secondary | ICD-10-CM | POA: Diagnosis not present

## 2020-05-04 DIAGNOSIS — I10 Essential (primary) hypertension: Secondary | ICD-10-CM

## 2020-05-04 NOTE — Patient Instructions (Addendum)
Medication Instructions:  Your physician recommends that you continue on your current medications as directed, BUT WOULD RECOMMEND YOU CHANGE TAKING THE [place].  Please refer to the Current Medication list given to you today.  *If you need a refill on your cardiac medications before your next appointment, please call your pharmacy*   Lab Work: None ordered  If you have labs (blood work) drawn today and your tests are completely normal, you will receive your results only by: Marland Kitchen MyChart Message (if you have MyChart) OR . A paper copy in the mail If you have any lab test that is abnormal or we need to change your treatment, we will call you to review the results.   Testing/Procedures: None ordered   Follow-Up: At [facility], you and your health needs are our priority.  As part of our continuing mission to provide you with exceptional heart care, we have created designated Provider Care Teams.  These Care Teams include your primary Cardiologist (physician) and Advanced Practice Providers (APPs -  Physician Assistants and Nurse Practitioners) who all work together to provide you with the care you need, when you need it.  We recommend signing up for the patient portal called "MyChart".  Sign up information is provided on this After Visit Summary.  MyChart is used to connect with patients for Virtual Visits (Telemedicine).  Patients are able to view lab/test results, encounter notes, upcoming appointments, etc.  Non-urgent messages can be sent to your provider as well.   To learn more about what you can do with MyChart, go to [email or web address].    Your next appointment:   12 month(s)  The format for your next appointment:   In Person  Provider:   You may see Lauree Chandler, MD or one of the following Advanced Practice Providers on your designated Care Team:    Melina Copa, PA-C  Ermalinda Barrios, PA-C    Other Instructions  Please work on decreasing your  caffeine intake.  I would recommend using a blood pressure cuff that goes on your arm. The wrist ones can be inaccurate. If possible, try to select one that also reports your heart rate. To check your blood pressure, choose a time at least 3 hours after taking your blood pressure medicines. If you can sample it at different times of the day, that's great - it might give you more information about how your blood pressure fluctuates. Remain seated in a chair for 5 minutes quietly beforehand, then check it. Please record a list of those readings and call us/send in MyChart message with them for our review in 1 week.

## 2020-09-14 ENCOUNTER — Telehealth: Payer: Self-pay

## 2020-09-14 ENCOUNTER — Other Ambulatory Visit: Payer: Self-pay | Admitting: Orthopaedic Surgery

## 2020-09-14 DIAGNOSIS — M25512 Pain in left shoulder: Secondary | ICD-10-CM

## 2020-09-14 NOTE — Telephone Encounter (Signed)
   [facility] Pre-operative Risk Assessment    Patient Name: Joshua Huynh  DOB: 1960/10/06 MRN: [id number]  HEARTCARE STAFF:  - IMPORTANT!!!!!! Under Visit Info/Reason for Call, type in Other and utilize the format Clearance MM/DD/YY or Clearance TBD. Do not use dashes or single digits. - Please review there is not already an duplicate clearance open for this procedure. - If request is for dental extraction, please clarify the # of teeth to be extracted. - If the patient is currently at the dentist's office, call Pre-Op Callback Staff (MA/nurse) to input urgent request.  - If the patient is not currently in the dentist office, please route to the Pre-Op pool.  Request for surgical clearance:  What type of surgery is being performed? Left total shoulder replacement   When is this surgery scheduled? TBD  What type of clearance is required (medical clearance vs. Pharmacy clearance to hold med vs. Both)?   Are there any medications that need to be held prior to surgery and how long? None specified   Practice name and name of physician performing surgery? [facility], Dr. Ophelia Charter  What is the office phone number? [phone] Venida Jarvis)   7.   What is the office fax number? [phone] ATT: Sherri   8.   Anesthesia type (None, local, MAC, general) ? Not specified   Mendel Ryder 09/14/2020, 5:20 PM  _________________________________________________________________   (provider comments below)

## 2020-09-17 NOTE — Telephone Encounter (Signed)
   Name: Joshua Huynh  DOB: 1960-09-08  MRN: [id number]   Primary Cardiologist: Lauree Chandler, MD  Chart reviewed as part of pre-operative protocol coverage. Patient was contacted 09/17/2020 in reference to pre-operative risk assessment for pending surgery as outlined below.  Joshua Huynh was last seen 04/2020 by myself. RCRI 0.4% indicating low risk of CV complications. I reached out to patient for update on how he is doing. The patient affirms he has been doing well without any new cardiac symptoms. Still exercising regularly without angina or dyspnea. BP remaining well controlled at home per his report. Therefore, based on ACC/AHA guidelines, the patient would be at acceptable risk for the planned procedure without further cardiovascular testing. The patient was advised that if he develops new symptoms prior to surgery to contact our office to arrange for a follow-up visit, and he verbalized understanding.  If it is absolutely required to hold aspirin for this procedure, there is not acute contraindication to holding from cardiac standpoint as he has no prior history of MI or PCI (does have history of possible coronary vasospasm).  I will route this recommendation to the requesting party via Epic fax function and remove from pre-op pool. Please call with questions.  Charlie Pitter, PA-C 09/17/2020, 9:04 AM

## 2020-09-22 ENCOUNTER — Ambulatory Visit
Admission: RE | Admit: 2020-09-22 | Discharge: 2020-09-22 | Disposition: A | Discharge status: Home / Self Care | Payer: Managed Care, Other (non HMO) | Source: Ambulatory Visit | Attending: Orthopaedic Surgery | Admitting: Orthopaedic Surgery

## 2020-09-22 ENCOUNTER — Other Ambulatory Visit: Payer: Self-pay

## 2020-09-22 DIAGNOSIS — M25512 Pain in left shoulder: Secondary | ICD-10-CM

## 2021-08-04 NOTE — Progress Notes (Deleted)
Cardiology Office Note    Date:  08/04/2021   ID:  Joshua Huynh, DOB Dec 30, 1960, MRN [id number]  PCP:  Haywood Pao, MD  Cardiologist:  Lauree Chandler, MD  Electrophysiologist:  None   Chief Complaint: ***  History of Present Illness:   Joshua Huynh is a 61 y.o. male with history of mild CAD by cath 02/2014, HTN, incidental PACs noted on prior EKG, increased intraocular pressure. He is known to have mild CAD by cardiac cath in February 2016 (20% mid LAD stenosis), done for abnormal ETT with significant ischemic ST depression 1.5- 2 mm horizontal ST depression in V5-V6 and in inferior leads with exercise. Vasospasm was presented as a possbile etiology of CP. Echo January 2016 with normal LV function, no significant valve issues. Last seen 04/2020 and doing well.    Labs since 2021  CAD Essential HTN Premature atrial contractions   Labwork independently reviewed: Scanned labs 06/2019 Cr 1.2, K 4.5, LFTs ok, Hgb 14.1, Plt 222, LDL 62   Cardiology Studies:   Studies reviewed are outlined and summarized above. Reports included below if pertinent.   2d echo 2016 Left ventricle: The cavity size was normal. Systolic function was    normal. The estimated ejection fraction was in the range of 55%    to 60%. Wall motion was normal; there were no regional wall    motion abnormalities. Left ventricular diastolic function    parameters were normal.    Cardiac Catheterization Operative Report   Kanan Sobek [id number] 2/5/20168:10 AM Haywood Pao, MD   Procedure Performed:  Selective Coronary Angiography   Operator: Lauree Chandler, MD   Arterial access site:  Right radial artery.    Indication:  61 yo male with history of HTN with recent chest pain. Exercise stress test with ST depression concerning for ischemia. Echo with normal LV function. Cardiac cath to exclude obstructive CAD.                                       Procedure  Details: The risks, benefits, complications, treatment options, and expected outcomes were discussed with the patient. The patient and/or family concurred with the proposed plan, giving informed consent. The patient was brought to the cath lab after IV hydration was begun and oral premedication was given. The patient was further sedated with Versed and Fentanyl. The right wrist was assessed with a modified Allens test which was positive. The right wrist was prepped and draped in a sterile fashion. 1% lidocaine was used for local anesthesia. Using the modified Seldinger access technique, a 5 French sheath was placed in the right radial artery. 3 mg Verapamil was given through the sheath. 3500 units IV heparin was given. Standard diagnostic catheters were used to perform selective coronary angiography. He had intense spasm in his right radial artery following the selective angiography. He was given an additional 3 mg Verapamil through the sheath. I was unable to pass the pigtail catheter to the LV. No left heart cath performed. I did not feel that the data to be obtained from crossing the aortic valve was necessary given the risk of catheter complications with ongoing radial artery spasm. (LV function normal by echo last week). The sheath was removed from the right radial artery and a Terumo hemostasis band was applied at the arteriotomy site on the right wrist.    There were no immediate complications. The patient  was taken to the recovery area in stable condition.    Hemodynamic Findings: Central aortic pressure: 116/66   Angiographic Findings:   Left main: No obstructive disease.    Left Anterior Descending Artery: Large caliber vessel that courses to the apex. There is a 20% stenosis in the mid vessel just beyond the takeoff of a small diagonal branch. The first diagonal branch is a small to moderate caliber vessel with no obstructive disease.    Circumflex Artery: Large dominant vessel with large  bifurcating obtuse marginal branch and moderate caliber posterolateral branch. No obstructive disease.    Right Coronary Artery: Small non-dominant vessel with no obstructive disease.    Left Ventricular Angiogram: Deferred (echo last week with normal LVEF)   Impression: 1. Mild non-obstructive CAD 2. Normal LV function by echo last week 3. Possible coronary artery vasospasm   Recommendations: Will start low dose statin for mild CAD and will give him a prescription for NTG to use SL prn for possible spasm. If he has recurrent episodes of chest pain, can consider Imdur or Norvasc for spasm.        Complications:  None. The patient tolerated the procedure well.            Past Medical History:  Diagnosis Date   Abnormal stress test 2016   CAD (coronary artery disease)    a. mild CAD by cardiac cath in February 2016 (20% mid LAD stenosis), done for abnormal ETT with significant ischemic ST depression 1.5- 2 mm horizontal ST depression in V5-V6 and in inferior leads with exercise. Vasospasm was presented as a possbile etiology of CP.   Hypertension    Intraocular pressure increase    Premature atrial contractions    a. noted on EKG 01/2018, asymptomatic.    Past Surgical History:  Procedure Laterality Date   ANKLE FRACTURE SURGERY     CORONARY ANGIOGRAM  03/03/2014   Procedure: CORONARY ANGIOGRAM;  Surgeon: Burnell Blanks, MD;  Location: [facility] CATH LAB;  Service: Cardiovascular;;   INGUINAL HERNIA REPAIR     x2   LUMBAR LAMINECTOMY/DECOMPRESSION MICRODISCECTOMY  09/10/2011   Procedure: LUMBAR LAMINECTOMY/DECOMPRESSION MICRODISCECTOMY 1 LEVEL;  Surgeon: Elaina Hoops, MD;  Location: [place] NEURO ORS;  Service: Neurosurgery;  Laterality: Right;  Right Lumbar Laminectomy/Decompression Microdiscectomy Lumbar Four-Five, Five-Sacral One    Current Medications: No outpatient medications have been marked as taking for the 08/06/21 encounter (Appointment) with Charlie Pitter, PA-C.   ***    Allergies:   Patient has no known allergies.   Social History   Socioeconomic History   Marital status: Married    Spouse name: Not on file   Number of children: 3   Years of education: Not on file   Highest education level: Not on file  Occupational History   Occupation: Environmental consultant  Tobacco Use   Smoking status: Never   Smokeless tobacco: Never  Substance and Sexual Activity   Alcohol use: Yes    Alcohol/week: 5.0 standard drinks of alcohol    Types: 5 Glasses of wine per week    Comment: 5    Drug use: No   Sexual activity: Not on file  Other Topics Concern   Not on file  Social History Narrative   Not on file   Social Determinants of Health   Financial Resource Strain: Not on file  Food Insecurity: Not on file  Transportation Needs: Not on file  Physical Activity: Not on file  Stress: Not on file  Social Connections: Not on file     Family History:  The patient's ***family history includes Coronary artery disease in his brother and maternal uncle; Diabetes in his father; Heart failure in his mother; Hyperlipidemia in his mother; Hypertension in his mother; Stroke in his father. There is no history of Colon cancer, Pancreatic cancer, Stomach cancer, Rectal cancer, or Esophageal cancer.  ROS:   Please see the history of present illness. Otherwise, review of systems is positive for ***.  All other systems are reviewed and otherwise negative.    EKG(s)/Additional Labs   EKG:  EKG is ordered today, personally reviewed, demonstrating ***  Recent Labs: No results found for requested labs within last 365 days.  Recent Lipid Panel No results found for: "CHOL", "TRIG", "HDL", "CHOLHDL", "VLDL", "LDLCALC", "LDLDIRECT"  PHYSICAL EXAM:    VS:  There were no vitals taken for this visit.  BMI: There is no height or weight on file to calculate BMI.  GEN: Well nourished, well developed male in no acute distress HEENT: normocephalic, atraumatic Neck: no JVD, carotid  bruits, or masses Cardiac: ***RRR; no murmurs, rubs, or gallops, no edema  Respiratory:  clear to auscultation bilaterally, normal work of breathing GI: soft, nontender, nondistended, + BS MS: no deformity or atrophy Skin: warm and dry, no rash Neuro:  Alert and Oriented x 3, Strength and sensation are intact, follows commands Psych: euthymic mood, full affect  Wt Readings from Last 3 Encounters:  05/04/20 176 lb 6.4 oz (80 kg)  04/22/19 173 lb 9.6 oz (78.7 kg)  04/04/19 174 lb (78.9 kg)     ASSESSMENT & PLAN:   ***     Disposition: F/u with ***   Medication Adjustments/Labs and Tests Ordered: Current medicines are reviewed at length with the patient today.  Concerns regarding medicines are outlined above. Medication changes, Labs and Tests ordered today are summarized above and listed in the Patient Instructions accessible in Encounters.   Signed, Charlie Pitter, PA-C  08/04/2021 10:23 AM    [place] Phone: [phone]; Fax: [phone]

## 2021-08-06 ENCOUNTER — Ambulatory Visit: Payer: Managed Care, Other (non HMO) | Admitting: Physician Assistant

## 2021-08-06 DIAGNOSIS — I491 Atrial premature depolarization: Secondary | ICD-10-CM

## 2021-08-06 DIAGNOSIS — I10 Essential (primary) hypertension: Secondary | ICD-10-CM

## 2021-08-06 DIAGNOSIS — I2510 Atherosclerotic heart disease of native coronary artery without angina pectoris: Secondary | ICD-10-CM

## 2021-08-06 NOTE — Progress Notes (Signed)
Cardiology Office Note    Date:  08/09/2021   ID:  Joshua Huynh, DOB Jan 14, 1961, MRN [id number]  PCP:  Haywood Pao, MD  Cardiologist:  Lauree Chandler, MD  Electrophysiologist:  None   Chief Complaint: routine follow-up, also reports episode of syncope 4 months prior  History of Present Illness:   Joshua Huynh is a 61 y.o. male with history of istory of mild CAD by cath 02/2014, HTN, incidental PACs noted on prior EKG, increased intraocular pressure. He is known to have mild CAD by cardiac cath in February 2016 (20% mid LAD stenosis), done for abnormal ETT with significant ischemic ST depression 1.5- 2 mm horizontal ST depression in V5-V6 and in inferior leads with exercise. Vasospasm was presented as a possbile etiology of CP. Echo January 2016 with normal LV function, no significant valve issues. Last seen 04/2020 and doing well.  He returns for follow-up largely doing well. Since last visit he had cataract surgery and rotator cuff surgery and tolerated these well. He does report that about 4 months ago he had an episode of extremely brief syncope, out for a second. He had woken up in the middle of the night, walked to the bathroom and had brief LOC without injury. No chest pain, palpitations, seizure activity around the time of event. He was taking cold medicine at the time as he was under the weather. This has not recurred. He also notes a few weeks ago he had an episode of focal chest wall discomfort that lasted most of the day described as ache that resolved within 20 minutes of taking Advil. He believes this was related to doing too much of a recent activity. He otherwise has been very active, exercising regularly at the gym, without provoking any angina or dyspnea. He lifts weights periodically and also routinely walks on the treadmill, I.e. at an incline for an hour. He feels well today.    Labwork independently reviewed: Copy of labs 07/15/21 A1C 5.1, LDL 60, trig  39, HDL 58, K 3.9, Cr 1.0, LFTs ok, Hgb 13.9, plt 193 Scanned labs 06/2019 Cr 1.2, K 4.5, LFTs ok, Hgb 14.1, Plt 222, LDL 62   Cardiology Studies:   Studies reviewed are outlined and summarized above. Reports included below if pertinent.   2D d echo 2016 Left ventricle: The cavity size was normal. Systolic function was    normal. The estimated ejection fraction was in the range of 55%    to 60%. Wall motion was normal; there were no regional wall    motion abnormalities. Left ventricular diastolic function    parameters were normal.    Cardiac Catheterization Operative Report   Leodan Bolyard [id number] 2/5/20168:10 AM Haywood Pao, MD   Procedure Performed:  Selective Coronary Angiography   Operator: Lauree Chandler, MD   Arterial access site:  Right radial artery.    Indication:  61 yo male with history of HTN with recent chest pain. Exercise stress test with ST depression concerning for ischemia. Echo with normal LV function. Cardiac cath to exclude obstructive CAD.                                       Procedure Details: The risks, benefits, complications, treatment options, and expected outcomes were discussed with the patient. The patient and/or family concurred with the proposed plan, giving informed consent. The patient was brought to the cath lab after  IV hydration was begun and oral premedication was given. The patient was further sedated with Versed and Fentanyl. The right wrist was assessed with a modified Allens test which was positive. The right wrist was prepped and draped in a sterile fashion. 1% lidocaine was used for local anesthesia. Using the modified Seldinger access technique, a 5 French sheath was placed in the right radial artery. 3 mg Verapamil was given through the sheath. 3500 units IV heparin was given. Standard diagnostic catheters were used to perform selective coronary angiography. He had intense spasm in his right radial artery following the  selective angiography. He was given an additional 3 mg Verapamil through the sheath. I was unable to pass the pigtail catheter to the LV. No left heart cath performed. I did not feel that the data to be obtained from crossing the aortic valve was necessary given the risk of catheter complications with ongoing radial artery spasm. (LV function normal by echo last week). The sheath was removed from the right radial artery and a Terumo hemostasis band was applied at the arteriotomy site on the right wrist.    There were no immediate complications. The patient was taken to the recovery area in stable condition.    Hemodynamic Findings: Central aortic pressure: 116/66   Angiographic Findings:   Left main: No obstructive disease.    Left Anterior Descending Artery: Large caliber vessel that courses to the apex. There is a 20% stenosis in the mid vessel just beyond the takeoff of a small diagonal branch. The first diagonal branch is a small to moderate caliber vessel with no obstructive disease.    Circumflex Artery: Large dominant vessel with large bifurcating obtuse marginal branch and moderate caliber posterolateral branch. No obstructive disease.    Right Coronary Artery: Small non-dominant vessel with no obstructive disease.    Left Ventricular Angiogram: Deferred (echo last week with normal LVEF)   Impression: 1. Mild non-obstructive CAD 2. Normal LV function by echo last week 3. Possible coronary artery vasospasm   Recommendations: Will start low dose statin for mild CAD and will give him a prescription for NTG to use SL prn for possible spasm. If he has recurrent episodes of chest pain, can consider Imdur or Norvasc for spasm.        Complications:  None. The patient tolerated the procedure well.            Past Medical History:  Diagnosis Date   Abnormal stress test 2016   CAD (coronary artery disease)    a. mild CAD by cardiac cath in February 2016 (20% mid LAD stenosis), done for  abnormal ETT with significant ischemic ST depression 1.5- 2 mm horizontal ST depression in V5-V6 and in inferior leads with exercise. Vasospasm was presented as a possbile etiology of CP.   Hypertension    Intraocular pressure increase    Premature atrial contractions    a. noted on EKG 01/2018, asymptomatic.    Past Surgical History:  Procedure Laterality Date   ANKLE FRACTURE SURGERY     CORONARY ANGIOGRAM  03/03/2014   Procedure: CORONARY ANGIOGRAM;  Surgeon: Burnell Blanks, MD;  Location: [facility] CATH LAB;  Service: Cardiovascular;;   INGUINAL HERNIA REPAIR     x2   LUMBAR LAMINECTOMY/DECOMPRESSION MICRODISCECTOMY  09/10/2011   Procedure: LUMBAR LAMINECTOMY/DECOMPRESSION MICRODISCECTOMY 1 LEVEL;  Surgeon: Elaina Hoops, MD;  Location: [place] NEURO ORS;  Service: Neurosurgery;  Laterality: Right;  Right Lumbar Laminectomy/Decompression Microdiscectomy Lumbar Four-Five, Five-Sacral One  Current Medications: Current Meds  Medication Sig   aspirin EC 81 MG tablet Take 1 tablet (81 mg total) by mouth daily.   atorvastatin (LIPITOR) 40 MG tablet Take 40 mg by mouth daily.   Cholecalciferol (VITAMIN D3) 2000 UNITS TABS Take 2,000 Units by mouth daily.    Cyanocobalamin (VITAMIN B12) 1000 MCG TBCR Take 1 tablet by mouth daily.   hydrochlorothiazide (MICROZIDE) 12.5 MG capsule Take 12.5 mg by mouth daily.   latanoprost (XALATAN) 0.005 % ophthalmic solution 1 drop at bedtime.   magnesium oxide (MAG-OX) 400 MG tablet Take 400 mg by mouth daily.    Menaquinone-7 (VITAMIN K2 PO) Take 2 capsules by mouth daily.   Multiple Vitamin (MULTIVITAMIN WITH MINERALS) TABS Take 1 tablet by mouth daily.   olmesartan (BENICAR) 40 MG tablet Take 40 mg by mouth daily.   Omega-3 Fatty Acids (FISH OIL) 1000 MG CAPS Take 1 capsule by mouth daily.   Probiotic Product (PROBIOTIC PO) Take 1 tablet by mouth daily.   sildenafil (VIAGRA) 100 MG tablet Take by mouth as needed.     Allergies:   Patient has no known  allergies.   Social History   Socioeconomic History   Marital status: Married    Spouse name: Not on file   Number of children: 3   Years of education: Not on file   Highest education level: Not on file  Occupational History   Occupation: Environmental consultant  Tobacco Use   Smoking status: Never   Smokeless tobacco: Never  Substance and Sexual Activity   Alcohol use: Yes    Alcohol/week: 5.0 standard drinks of alcohol    Types: 5 Glasses of wine per week    Comment: 5    Drug use: No   Sexual activity: Not on file  Other Topics Concern   Not on file  Social History Narrative   Not on file   Social Determinants of Health   Financial Resource Strain: Not on file  Food Insecurity: Not on file  Transportation Needs: Not on file  Physical Activity: Not on file  Stress: Not on file  Social Connections: Not on file     Family History:  The patient's family history includes Coronary artery disease in his brother and maternal uncle; Diabetes in his father; Heart failure in his mother; Hyperlipidemia in his mother; Hypertension in his mother; Stroke in his father. There is no history of Colon cancer, Pancreatic cancer, Stomach cancer, Rectal cancer, or Esophageal cancer.  ROS:   Please see the history of present illness.  All other systems are reviewed and otherwise negative.    EKG(s)/Additional Labs   EKG:  EKG is ordered today, personally reviewed, demonstrating NSR 60bpm, TWI III otherwise normal. Stable compared to prior.  Recent Labs: No results found for requested labs within last 365 days.  Recent Lipid Panel No results found for: "CHOL", "TRIG", "HDL", "CHOLHDL", "VLDL", "LDLCALC", "LDLDIRECT"  PHYSICAL EXAM:    VS:  BP 118/80   Pulse 60   Ht 5' 10.5" (1.791 m)   Wt 161 lb (73 kg)   SpO2 99%   BMI 22.77 kg/m   BMI: Body mass index is 22.77 kg/m.  GEN: Well nourished, well developed male in no acute distress HEENT: normocephalic, atraumatic Neck: no JVD,  carotid bruits, or masses Cardiac: RRR; no murmurs, rubs, or gallops, no edema  Respiratory:  clear to auscultation bilaterally, normal work of breathing GI: soft, nontender, nondistended, + BS MS: no deformity or atrophy  Skin: warm and dry, no rash Neuro:  Alert and Oriented x 3, Strength and sensation are intact, follows commands Psych: euthymic mood, full affect  Wt Readings from Last 3 Encounters:  08/09/21 161 lb (73 kg)  05/04/20 176 lb 6.4 oz (80 kg)  04/22/19 173 lb 9.6 oz (78.7 kg)     ASSESSMENT & PLAN:   1. CAD - overall stable. He had episode of CP several weeks ago that sounds more MSK in nature that resolved quickly with ibuprofen; has been able to participate regularly in high exertion activities without provoking angina or dyspnea. Continue ASA, statin. Lipids are followed by primary care.  2. Syncope - mechanism sounds orthostatic in the context of abrupt position change in the middle of the night while being under the weather taking cold medication. No other specific high risk features. No prior history of family history of similar issues. No neurologic changes or exertional symptoms. Did not seek care at that time. Will check 2D echo and carotid duplex for completeness. Would consider event monitor if any further symptoms occur. Since he has been otherwise free of recurrent issues for months and this was otherwise an isolated episode, will hold off at this time. Warning symptoms reviewed -> advised that an additional episode should prompt emergent evaluation.  3. Essential HTN - controlled on present regimen. Recent labs reviewed from PCP as above.  4. Premature atrial contractions - quiescent by EKG and exam. Does not currently require any therapy for these at present time.    Disposition: F/u with me or Dr. Angelena Form in 1 year, sooner if testing is abnormal as above.   Medication Adjustments/Labs and Tests Ordered: Current medicines are reviewed at length with the  patient today.  Concerns regarding medicines are outlined above. Medication changes, Labs and Tests ordered today are summarized above and listed in the Patient Instructions accessible in Encounters.   Signed, Charlie Pitter, PA-C  08/09/2021 8:53 AM    [place] Phone: [phone]; Fax: [phone]

## 2021-08-09 ENCOUNTER — Encounter: Payer: Self-pay | Admitting: Physician Assistant

## 2021-08-09 ENCOUNTER — Ambulatory Visit (INDEPENDENT_AMBULATORY_CARE_PROVIDER_SITE_OTHER): Payer: Managed Care, Other (non HMO) | Admitting: Physician Assistant

## 2021-08-09 VITALS — BP 118/80 | HR 60 | Ht 70.5 in | Wt 161.0 lb

## 2021-08-09 DIAGNOSIS — R55 Syncope and collapse: Secondary | ICD-10-CM | POA: Diagnosis not present

## 2021-08-09 DIAGNOSIS — I491 Atrial premature depolarization: Secondary | ICD-10-CM

## 2021-08-09 DIAGNOSIS — I2510 Atherosclerotic heart disease of native coronary artery without angina pectoris: Secondary | ICD-10-CM | POA: Diagnosis not present

## 2021-08-09 DIAGNOSIS — I10 Essential (primary) hypertension: Secondary | ICD-10-CM

## 2021-08-09 NOTE — Patient Instructions (Signed)
Medication Instructions:  Your physician recommends that you continue on your current medications as directed. Please refer to the Current Medication list given to you today. *If you need a refill on your cardiac medications before your next appointment, please call your pharmacy*   Lab Work: NONE ORDERED    Testing/Procedures: Your physician has requested that you have an echocardiogram. Echocardiography is a painless test that uses sound waves to create images of your heart. It provides your doctor with information about the size and shape of your heart and how well your heart's chambers and valves are working. This procedure takes approximately one hour. There are no restrictions for this procedure.  Your physician has requested that you have a carotid duplex. This test is an ultrasound of the carotid arteries in your neck. It looks at blood flow through these arteries that supply the brain with blood. Allow one hour for this exam. There are no restrictions or special instructions.  Follow-Up: At [facility], you and your health needs are our priority.  As part of our continuing mission to provide you with exceptional heart care, we have created designated Provider Care Teams.  These Care Teams include your primary Cardiologist (physician) and Advanced Practice Providers (APPs -  Physician Assistants and Nurse Practitioners) who all work together to provide you with the care you need, when you need it.  We recommend signing up for the patient portal called "MyChart".  Sign up information is provided on this After Visit Summary.  MyChart is used to connect with patients for Virtual Visits (Telemedicine).  Patients are able to view lab/test results, encounter notes, upcoming appointments, etc.  Non-urgent messages can be sent to your provider as well.   To learn more about what you can do with MyChart, go to [email or web address].    Your next appointment:   12 month(s)  The format for  your next appointment:   In Person  Provider:   Lauree Chandler, MD     Other Instructions   Important Information About Sugar

## 2021-08-20 ENCOUNTER — Other Ambulatory Visit: Payer: Self-pay | Admitting: Physician Assistant

## 2021-08-20 DIAGNOSIS — R55 Syncope and collapse: Secondary | ICD-10-CM

## 2021-08-23 ENCOUNTER — Ambulatory Visit (HOSPITAL_COMMUNITY): Payer: Managed Care, Other (non HMO) | Attending: Internal Medicine

## 2021-08-23 DIAGNOSIS — I2510 Atherosclerotic heart disease of native coronary artery without angina pectoris: Secondary | ICD-10-CM | POA: Diagnosis not present

## 2021-08-23 DIAGNOSIS — I517 Cardiomegaly: Secondary | ICD-10-CM | POA: Insufficient documentation

## 2021-08-23 DIAGNOSIS — I10 Essential (primary) hypertension: Secondary | ICD-10-CM

## 2021-08-23 DIAGNOSIS — R55 Syncope and collapse: Secondary | ICD-10-CM | POA: Diagnosis not present

## 2021-08-23 DIAGNOSIS — I491 Atrial premature depolarization: Secondary | ICD-10-CM | POA: Diagnosis not present

## 2021-08-23 LAB — ECHOCARDIOGRAM COMPLETE
Area-P 1/2: 3.03 cm2
S' Lateral: 3.4 cm

## 2021-08-30 ENCOUNTER — Ambulatory Visit (HOSPITAL_COMMUNITY)
Admission: RE | Admit: 2021-08-30 | Discharge: 2021-08-30 | Disposition: A | Discharge status: Home / Self Care | Payer: Managed Care, Other (non HMO) | Source: Ambulatory Visit | Attending: Cardiology | Admitting: Cardiology

## 2021-08-30 DIAGNOSIS — R55 Syncope and collapse: Secondary | ICD-10-CM | POA: Insufficient documentation

## 2022-05-11 IMAGING — CT CT SHOULDER*L* W/O CM
1 series · 12 of 14 positions shown, 15 images · non-contrast
Comparison: None.

CLINICAL DATA: Chronic left shoulder pain

EXAM:
CT OF THE UPPER LEFT EXTREMITY WITHOUT CONTRAST
TECHNIQUE: Multidetector CT imaging of the upper left extremity was performed
according to the standard protocol.

[Series 3: soft tissue · axial · 0.52mm/px · z∈[-231,-55]mm · 12 of 105 slices shown, 15 images]
[im 9/105  soft-tissue]
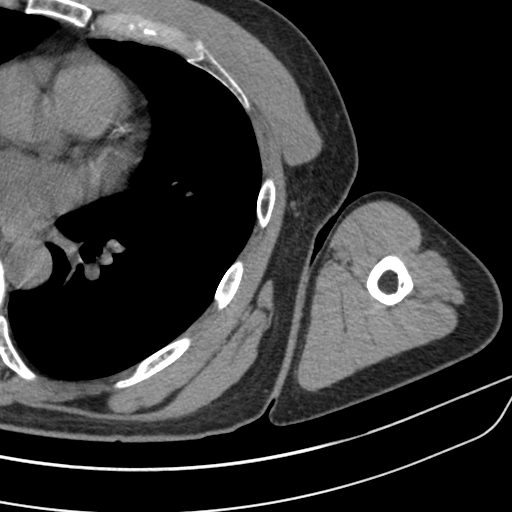
[im 9/105  bone]
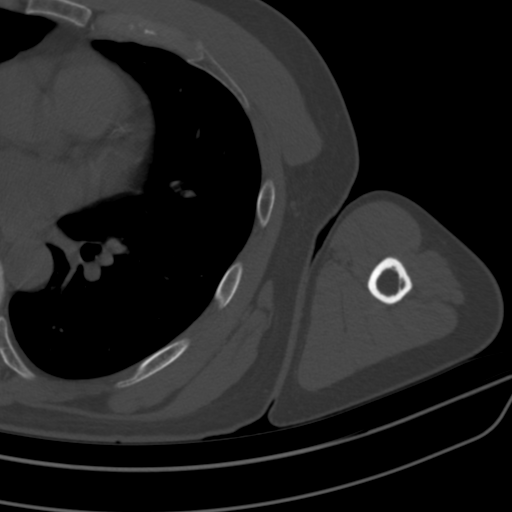
[im 17/105  bone]
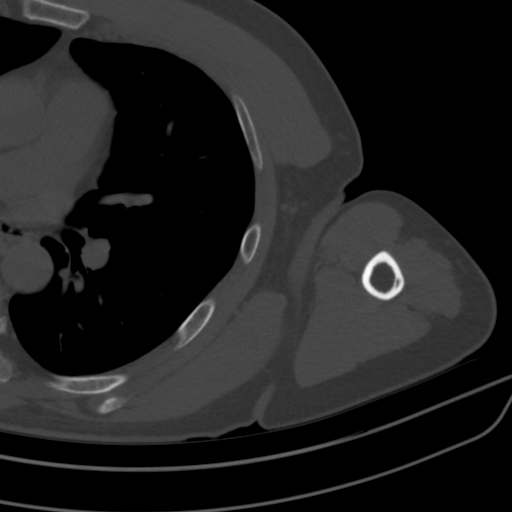
[im 25/105  bone]
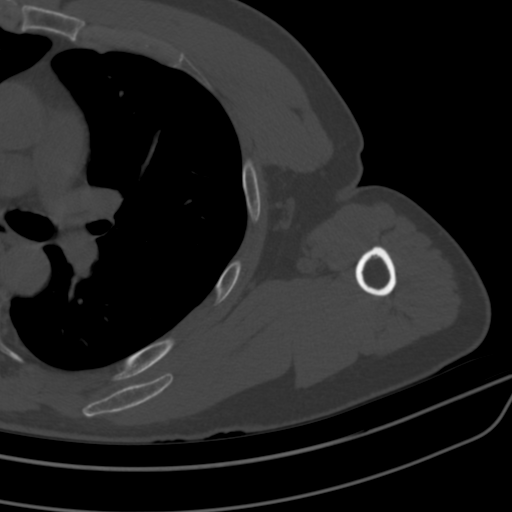
[im 33/105  bone]
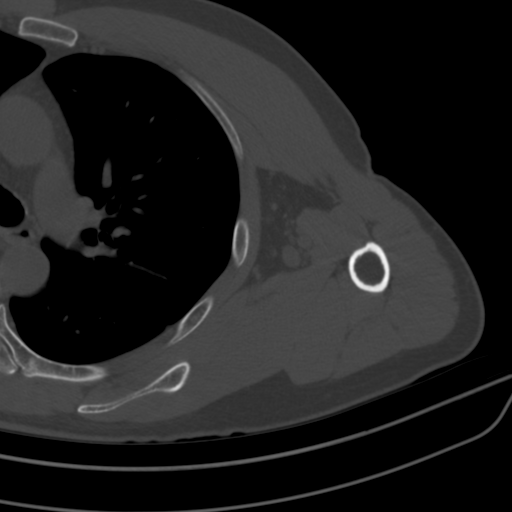
[im 41/105  soft-tissue]
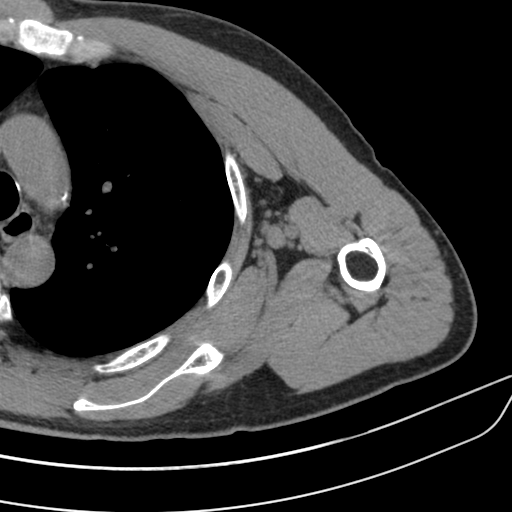
[im 41/105  bone]
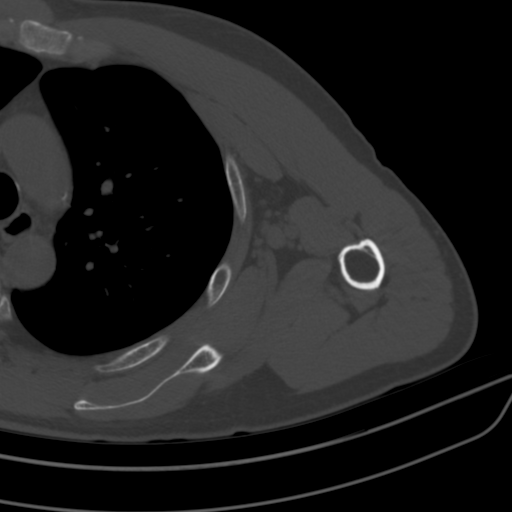
[im 49/105  bone]
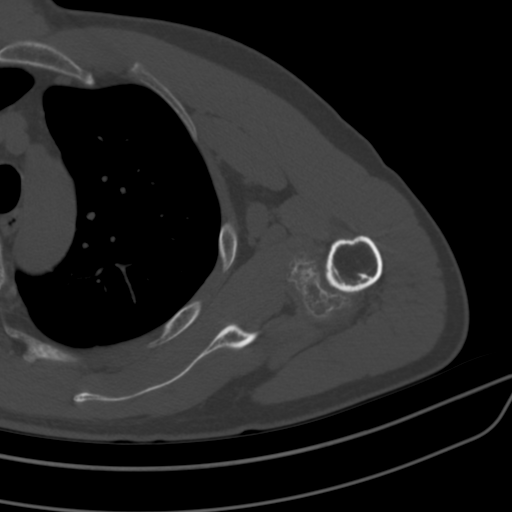
[im 57/105  bone]
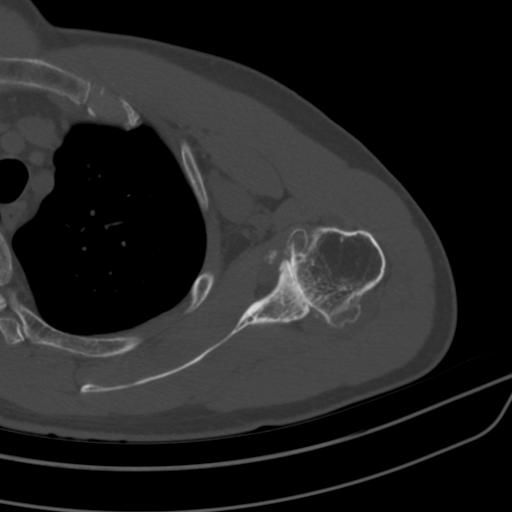
[im 65/105  bone]
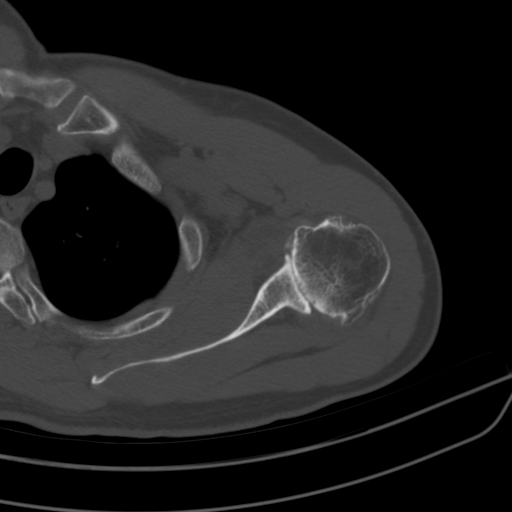
[im 73/105  soft-tissue]
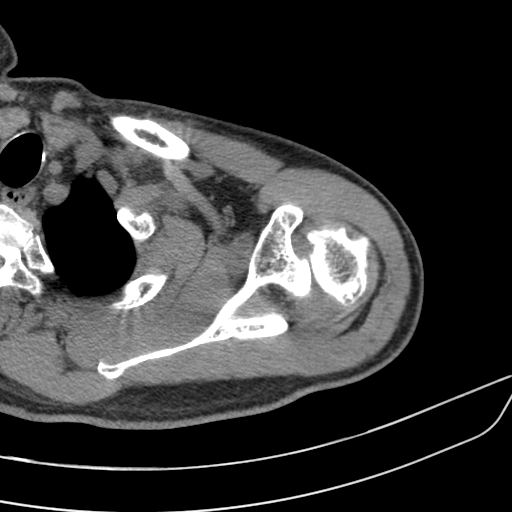
[im 73/105  bone]
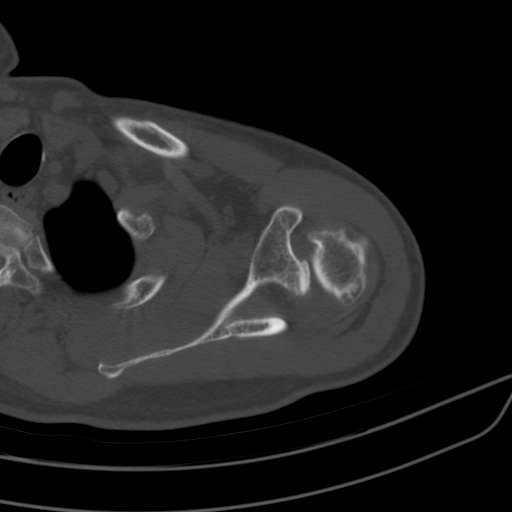
[im 81/105  bone]
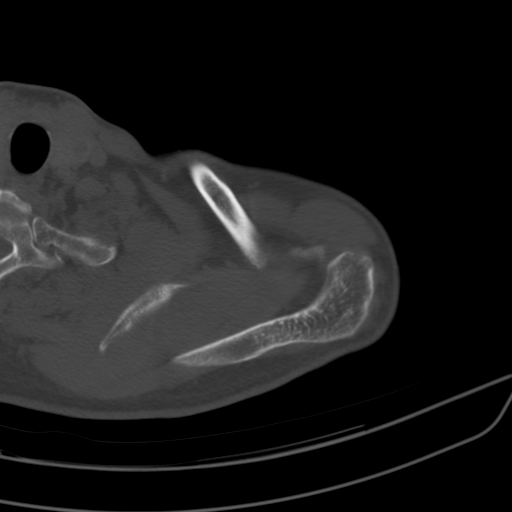
[im 89/105  bone]
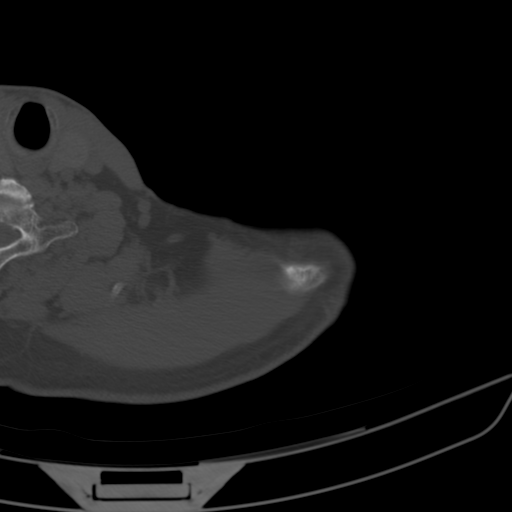
[im 97/105  bone]
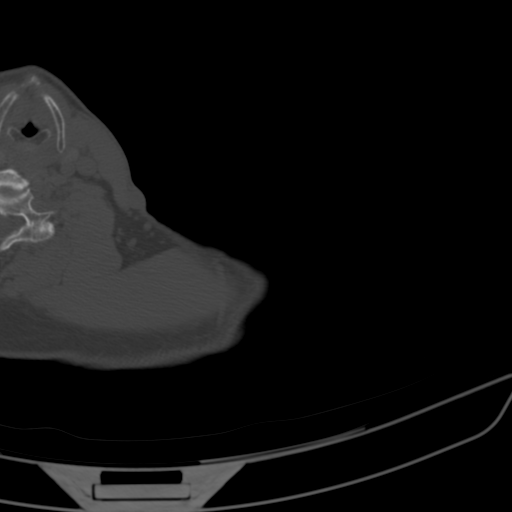

[12 of 14 positions shown; findings below may reference images not displayed]

FINDINGS: Bones/Joint/Cartilage

No acute fracture. No dislocation. Severe glenohumeral joint
osteoarthritis manifested by complete joint space loss, subchondral
sclerosis/cystic change, and bulky marginal osteophyte formation. No
significant subchondral cystic changes of the glenoid. A small
glenohumeral joint effusion is evident. Loose bodies are seen within
the joint capsule and subscapularis joint recess.

The acromioclavicular joint is within normal limits. No large
subacromial-subdeltoid bursal fluid collection. Remaining visualized
osseous structures appear grossly intact.

Ligaments

Suboptimally assessed by CT.

Muscles and Tendons

Preserved muscle bulk of the rotator cuff musculature without
significant atrophy or fatty infiltration. Rotator cuff tendons
appear grossly intact within the limitations of CT.

Soft tissues

No soft tissue fluid collection or hematoma. No axillary
lymphadenopathy. Visualized lung field is clear. Aortic and coronary
artery atherosclerosis. 9 mm left thyroid lobe nodule. Not
clinically significant; no follow-up imaging recommended (ref: [HOSPITAL]. [DATE]): 143-50).
IMPRESSION: 1. Severe glenohumeral joint osteoarthritis.
2. Small joint effusion with multiple loose bodies.

Aortic Atherosclerosis ([id number]).

## 2023-11-21 ENCOUNTER — Other Ambulatory Visit: Payer: Self-pay | Admitting: Medical Genetics

## 2023-12-29 ENCOUNTER — Other Ambulatory Visit: Payer: Self-pay

## 2023-12-29 DIAGNOSIS — Z006 Encounter for examination for normal comparison and control in clinical research program: Secondary | ICD-10-CM

## 2024-01-06 LAB — GENECONNECT MOLECULAR SCREEN: Genetic Analysis Overall Interpretation: NEGATIVE
